# Patient Record
Sex: Male | Born: 1995 | Race: Black or African American | Hispanic: No | Marital: Single | Smoking: Never smoker
Health system: Southern US, Community
[De-identification: ages and names within clinical notes are randomized; demographics above are authoritative.]

## PROBLEM LIST (undated history)

## (undated) DIAGNOSIS — R109 Unspecified abdominal pain: Secondary | ICD-10-CM

## (undated) DIAGNOSIS — L309 Dermatitis, unspecified: Secondary | ICD-10-CM

## (undated) DIAGNOSIS — R110 Nausea: Secondary | ICD-10-CM

## (undated) DIAGNOSIS — R634 Abnormal weight loss: Secondary | ICD-10-CM

## (undated) DIAGNOSIS — R630 Anorexia: Secondary | ICD-10-CM

## (undated) DIAGNOSIS — K5900 Constipation, unspecified: Secondary | ICD-10-CM

## (undated) DIAGNOSIS — J45909 Unspecified asthma, uncomplicated: Secondary | ICD-10-CM

## (undated) HISTORY — DX: Unspecified asthma, uncomplicated: J45.909

## (undated) HISTORY — DX: Constipation, unspecified: K59.00

## (undated) HISTORY — DX: Dermatitis, unspecified: L30.9

## (undated) HISTORY — DX: Abnormal weight loss: R63.4

## (undated) HISTORY — DX: Nausea: R11.0

## (undated) HISTORY — DX: Anorexia: R63.0

## (undated) HISTORY — DX: Unspecified abdominal pain: R10.9

---
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
## 2017-06-14 DIAGNOSIS — J209 Acute bronchitis, unspecified: Secondary | ICD-10-CM | POA: Diagnosis not present

## 2017-07-11 ENCOUNTER — Encounter: Payer: Self-pay | Admitting: Allergy & Immunology

## 2017-07-11 ENCOUNTER — Ambulatory Visit (INDEPENDENT_AMBULATORY_CARE_PROVIDER_SITE_OTHER): Payer: 59 | Admitting: Allergy & Immunology

## 2017-07-11 VITALS — BP 118/72 | HR 72 | Temp 98.6°F | Resp 14 | Ht 68.0 in | Wt 158.0 lb

## 2017-07-11 DIAGNOSIS — J4520 Mild intermittent asthma, uncomplicated: Secondary | ICD-10-CM

## 2017-07-11 DIAGNOSIS — J302 Other seasonal allergic rhinitis: Secondary | ICD-10-CM

## 2017-07-11 DIAGNOSIS — J3089 Other allergic rhinitis: Secondary | ICD-10-CM

## 2017-07-11 DIAGNOSIS — T7801XD Anaphylactic reaction due to peanuts, subsequent encounter: Secondary | ICD-10-CM | POA: Diagnosis not present

## 2017-07-11 MED ORDER — EPINEPHRINE 0.3 MG/0.3ML IJ SOAJ: 0.3000 mg | Freq: Once | INTRAMUSCULAR | 2 refills | Status: AC

## 2017-07-11 MED ORDER — OLOPATADINE HCL 0.6 % NA SOLN: 2.0000 | Freq: Every day | NASAL | 3 refills | Status: DC | PRN

## 2017-07-11 MED ORDER — FEXOFENADINE HCL 180 MG PO TABS: 180.0000 mg | ORAL_TABLET | Freq: Every day | ORAL | 5 refills | Status: DC

## 2017-07-11 NOTE — Progress Notes (Signed)
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 Seasonal and perennial allergic rhinitis - Testing today showed: trees, weeds, grasses, molds, dust mites and cockroach  - Stop the Zyrtec and the Singulair. - Avoidance measures provided. - Continue with Flonase (fluticasone) two sprays per nostril daily - Start Allegra (fexofenadine) 157m table once daily and Astelin (azelastine) 2 sprays per nostril 1-2 times daily as needed - You can use an extra dose of the antihistamine, if needed, for breakthrough symptoms.  - Consider nasal saline rinses 1-2 times daily to remove allergens from the nasal cavities as well as help with mucous clearance (this is especially helpful to do before the nasal sprays are given) - Consider allergy shots as a means of long-term control. - Allergy shots "re-train" and "reset" the immune system to ignore environmental allergens and decrease the resulting immune response to those allergens (sneezing, itchy watery eyes, runny nose, nasal congestion, etc).    - Allergy shots improve symptoms in 75-85% of patients.  - We can discuss more at the next appointment if the medications are not working for you.  2. Peanut-induced anaphylaxis - Testing was positive to peanut but negative to the tree nuts and the milk. - You can introduce tree nuts at home, as long as you are careful about cross contamination. - We will send in a prescription for AuviQ instead of EpiPen. - They should call you in 1-2 days to confirm your shipping address.  3. Mild intermittent asthma, uncomplicated -  Testing was normal today. - We do not need a controller medication at this time. - Continue with albuterol 4 puffs every 4-6 hours as needed.  4. Return in about 3 months (around 10/10/2017).   Subjective:   MDemontrae Gilbertis a 21y.o. male presenting today for evaluation of  Chief Complaint  Patient presents with  . Allergic Rhinitis   . Asthma    very well controlled.   . Food Intolerance    peanuts, dairy.     MKiril Hippehas a history of the following: Patient Active Problem List   Diagnosis Date Noted  . Periumbilical abdominal pain   . Nausea   . Decreased appetite   . Weight loss   . Unspecified constipation     History obtained from: chart review and patient and his mother.  MZannie Covewas referred by WOrpha Bur DO.     MKollynis a 21y.o. male presenting to re-establish care.  He was followed by Dr. HIshmael Holter who has since left this practice. Unfortunately, we do not have his paper chart because we are in the midst of scanning in all of our paper charts. It has definitely been more than three years, however, according to his mother.   Asthma/Respiratory Symptom History: He was diagnosed with asthma when he was much younger. He has albuterol as well as Singulair at night. He denies nighttime coughing and daytime coughing and wheezing. He estimates that he uses his albuterol less than once per week. He denies ED visits and UC visits.   Allergic Rhinitis Symptom History: He has nasal congestion on a chronic basis throughout the year, even in  the winter. He used to it at this point. He does have occasional sneezing. He denies ocular symptoms. Symptoms remain constant throughout different environments. It flares with changes in the seasons.   Food Allergy Symptom History: He has a history of a peanut allergy with hives and swelling. He was age 38 months. He thinks that it was elementary school when he was last tested. Evidently, he had a blood drawn in middle school  from his PCP and was still allergic. He avoids all tree nuts as well. He eats eggs but apparently this was positive at some point. He did have a positive dairy test at some point but he eats it all of the time. He does have an up to date EpiPen but has never had to use it.   Otherwise, there is no history of other atopic diseases, including drug allergies, stinging insect allergies, or urticaria. There is no significant infectious history. Vaccinations are up to date.    Past Medical History: Patient Active Problem List   Diagnosis Date Noted  . Periumbilical abdominal pain   . Nausea   . Decreased appetite   . Weight loss   . Unspecified constipation     Medication List:  Allergies as of 07/11/2017      Reactions   Food    Eggs, peanuts      Medication List       Accurate as of 07/11/17 11:05 AM. Always use your most recent med list.          albuterol 108 (90 Base) MCG/ACT inhaler Commonly known as:  PROVENTIL HFA;VENTOLIN HFA Inhale 1 puff into the lungs every 6 (six) hours as needed for wheezing or shortness of breath.   EPINEPHrine 0.3 mg/0.3 mL Soaj injection Commonly known as:  AUVI-Q Inject 0.3 mLs (0.3 mg total) into the muscle once.   fexofenadine 180 MG tablet Commonly known as:  ALLEGRA Take 1 tablet (180 mg total) by mouth daily.   fluticasone 50 MCG/ACT nasal spray Commonly known as:  FLONASE Place into the nose.   Olopatadine HCl 0.6 % Soln Place 2 drops (2 puffs total) into the nose daily as needed.            Discharge Care Instructions        Start     Ordered   07/11/17 0000  Spirometry with Graph    Question:  Where should this test be performed?  Answer:  Other   07/11/17 1046   07/11/17 0000  Allergy Test    Question:  Allergy test to perform  Answer:  1-59 and select foods   07/11/17 1046   07/11/17 0000  EPINEPHrine (AUVI-Q) 0.3 mg/0.3 mL IJ SOAJ injection   Once    Comments:  [phone] (H)   07/11/17 1046   07/11/17 0000   fexofenadine (ALLEGRA) 180 MG tablet  Daily     07/11/17 1046   07/11/17 0000  Olopatadine HCl 0.6 % SOLN  Daily PRN     07/11/17 1046      Birth History: non-contributory. Born at term without complications.   Developmental History: Yaniel has met all milestones on time. He has required no speech therapy, occupational therapy, or physical therapy.   Past Surgical History: History reviewed. No pertinent surgical history.   Family History: Family History  Problem Relation Age of Onset  . Irritable bowel syndrome Mother   . Cholelithiasis Neg Hx   . Ulcers Neg Hx      Social  History: Zakar lives at home with his mother. 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 They live in a house that is 19 years old. There is laminate throughout the entire house. They have gas heating and central cooling. There is one dog in the home. He does have older siblings, but they do not live in the house. There are no dust mite coverings on the bedding and there is no tobacco exposure.     Review of Systems: a 14-point review of systems is pertinent for what is mentioned in HPI.  Otherwise, all other systems were negative. Constitutional: negative other than that listed in the HPI Eyes: negative other than that listed in the HPI Ears, nose, mouth, throat, and face: negative other than that listed in the HPI Respiratory: negative other than that listed in the HPI Cardiovascular: negative other than that listed in the HPI Gastrointestinal: negative other than that listed in the HPI Genitourinary: negative other than that listed in the HPI Integument: negative other than that listed in the HPI Hematologic: negative other than that listed in the HPI Musculoskeletal: negative other than that listed in the HPI Neurological: negative other than that listed in the HPI Allergy/Immunologic: negative other than that listed in the HPI    Objective:   Blood pressure 118/72,  pulse 72, temperature 98.6 F (37 C), temperature source Oral, resp. rate 14, height _0  (1.727 m), weight 158 lb (71.7 kg), SpO2 98 %. Body mass index is 24.02 kg/m.   Physical Exam:  General: Alert, interactive, in no acute distress. Pleasant male.  Eyes: No conjunctival injection present on the right, No conjunctival injection present on the left, PERRL bilaterally, No discharge on the right, No discharge on the left and No Horner-Trantas dots present Ears: Right TM pearly gray with normal light reflex, Left TM pearly gray with normal light reflex, Right TM intact without perforation and Left TM intact without perforation.  Nose/Throat: External nose within normal limits and septum midline, turbinates markedly edematous with clear discharge, post-pharynx erythematous with cobblestoning in the posterior oropharynx. Tonsils 2+ without exudates Neck: Supple without thyromegaly.  Adenopathy: shoddy bilateral anterior cervical lymphadenopathy. and no enlarged lymph nodes appreciated in the occipital, axillary, epitrochlear, inguinal, or popliteal regions. Lungs: Clear to auscultation without wheezing, rhonchi or rales. No increased work of breathing. CV: Normal S1/S2, no murmurs. Capillary refill <2 seconds.  Abdomen: Nondistended, nontender. No guarding or rebound tenderness. Bowel sounds present in all fields and hypoactive  Skin: Warm and dry, without lesions or rashes. Extremities:  No clubbing, cyanosis or edema. Neuro:   Grossly intact. No focal deficits appreciated. Responsive to questions.  Diagnostic studies:   Spirometry: results normal (FEV1: 3.96/106%, FVC: 4.92/114%, FEV1/FVC: 80%).    Spirometry consistent with normal pattern.   Allergy Studies:   Indoor/Outdoor Percutaneous Adult Environmental Panel: positive to [place] grass, English plantain, hickory, oak, pine, Alternaria, Cladosporium, Aspergillus, Penicillium, Bipolaris, Drechslera, Fusarium, Aureobasidium, Botrytis,  epicoccum, Phoma, Candida, Df mite, Dp mites and cockroach. Otherwise negative with adequate controls.  Selected Food Panel: positive to Peanut (30 x 40) with adequate controls. Negative to Milk, Casein, [place], [place], [place], [place] and [place] nut       Salvatore Marvel, MD [facility]

## 2017-07-11 NOTE — Patient Instructions (Addendum)
1. Seasonal and perennial allergic rhinitis - Testing today showed: trees, weeds, grasses, molds, dust mites and cockroach  - Stop the Zyrtec and the Singulair. - Avoidance measures provided. - Continue with Flonase (fluticasone) two sprays per nostril daily - Start Allegra (fexofenadine)  table once daily and Astelin (azelastine) 2 sprays per nostril 1-2 times daily as needed - You can use an extra dose of the antihistamine, if needed, for breakthrough symptoms.  - Consider nasal saline rinses 1-2 times daily to remove allergens from the nasal cavities as well as help with mucous clearance (this is especially helpful to do before the nasal sprays are given) - Consider allergy shots as a means of long-term control. - Allergy shots "re-train" and "reset" the immune system to ignore environmental allergens and decrease the resulting immune response to those allergens (sneezing, itchy watery eyes, runny nose, nasal congestion, etc).    - Allergy shots improve symptoms in 75-85% of patients.  - We can discuss more at the next appointment if the medications are not working for you.  2. Peanut-induced anaphylaxis, subsequent encounter - Testing was positive to peanut but negative to the tree nuts and the milk. - You can introduce tree nuts at home, as long as you are careful about cross contamination. - We will send in a prescription for AuviQ instead of EpiPen. - They should call you in 1-2 days to confirm your shipping address.  3. Mild intermittent asthma, uncomplicated - Testing was normal today. - We do not need a controller medication at this time. - Continue with albuterol 4 puffs every 4-6 hours as needed.  4. Return in about 3 months (around 10/10/2017).   Please inform us of any Emergency Department visits, hospitalizations, or changes in symptoms. Call us before going to the ED for breathing or allergy symptoms since we might be able to fit you in for a sick visit. Feel free to  contact us anytime with any questions, problems, or concerns.  It was a pleasure to meet you and your family today! Enjoy the new school year!  Websites that have reliable patient information: 1. American Academy of Asthma, Allergy, and Immunology: www.aaaai.org 2. Food Allergy Research and Education (FARE): foodallergy.org 3. Mothers of Asthmatics: http://www.asthmacommunitynetwork.org 4. American College of Allergy, Asthma, and Immunology: www.acaai.org   Election Day is coming up on Tuesday, November 6th! Make your voice heard! Register to vote at [email or web address]!     Reducing Pollen Exposure  The American Academy of Allergy, Asthma and Immunology suggests the following steps to reduce your exposure to pollen during allergy seasons.    1. Do not hang sheets or clothing out to dry; pollen may collect on these items. 2. Do not mow lawns or spend time around freshly cut grass; mowing stirs up pollen. 3. Keep windows closed at night.  Keep car windows closed while driving. 4. Minimize morning activities outdoors, a time when pollen counts are usually at their highest. 5. Stay indoors as much as possible when pollen counts or humidity is high and on windy days when pollen tends to remain in the air longer. 6. Use air conditioning when possible.  Many air conditioners have filters that trap the pollen spores. 7. Use a HEPA room air filter to remove pollen form the indoor air you breathe.  Control of Mold Allergen  Mold and fungi can grow on a variety of surfaces provided certain temperature and moisture conditions exist.  Outdoor molds grow on plants, decaying vegetation and soil.  The major outdoor mold, Alternaria and Cladosporium, are found in very high numbers during hot and dry conditions.  Generally, a late Summer - Fall peak is seen for common outdoor fungal spores.  Rain will temporarily lower outdoor mold spore count, but counts rise rapidly when the rainy period ends.  The most important  indoor molds are Aspergillus and Penicillium.  Dark, humid and poorly ventilated basements are ideal sites for mold growth.  The next most common sites of mold growth are the bathroom and the kitchen.  Outdoor MicrosoftMold Control 1. Use air conditioning and keep windows closed 2. Avoid exposure to decaying vegetation. 3. Avoid leaf raking. 4. Avoid grain handling. 5. Consider wearing a face mask if working in moldy areas.  Indoor Mold Control 1. Maintain humidity below 50%. 2. Clean washable surfaces with 5% bleach solution. 3. Remove sources e.g. contaminated carpets.  Control of House Dust Mite Allergen    House dust mites play a major role in allergic asthma and rhinitis.  They occur in environments with high humidity wherever human skin, the food for dust mites is found. High levels have been detected in dust obtained from mattresses, pillows, carpets, upholstered furniture, bed covers, clothes and soft toys.  The principal allergen of the house dust mite is found in its feces.  A gram of dust may contain 1,000 mites and 250,000 fecal particles.  Mite antigen is easily measured in the air during house cleaning activities.    1. Encase mattresses, including the box spring, and pillow, in an air tight cover.  Seal the zipper end of the encased mattresses with wide adhesive tape. 2. Wash the bedding in water of 130 degrees Farenheit weekly.  Avoid cotton comforters/quilts and flannel bedding: the most ideal bed covering is the dacron comforter. 3. Remove all upholstered furniture from the bedroom. 4. Remove carpets, carpet padding, rugs, and non-washable window drapes from the bedroom.  Wash drapes weekly or use plastic window coverings. 5. Remove all non-washable stuffed toys from the bedroom.  Wash stuffed toys weekly. 6. Have the room cleaned frequently with a vacuum cleaner and a damp dust-mop.  The patient should not be in a room which is being cleaned and should wait 1 hour after cleaning  before going into the room. 7. Close and seal all heating outlets in the bedroom.  Otherwise, the room will become filled with dust-laden air.  An electric heater can be used to heat the room. 8. Reduce indoor humidity to less than 50%.  Do not use a humidifier.  Control of Cockroach Allergen  Cockroach allergen has been identified as an important cause of acute attacks of asthma, especially in urban settings.  There are fifty-five species of cockroach that exist in the [place], however only three, the [place], [place] and Oriental species produce allergen that can affect patients with Asthma.  Allergens can be obtained from fecal particles, egg casings and secretions from cockroaches.    1. Remove food sources. 2. Reduce access to water. 3. Seal access and entry points. 4. Spray runways with 0.5-1% Diazinon or Chlorpyrifos 5. Blow boric acid power under stoves and refrigerator. 6. Place bait stations (hydramethylnon) at feeding sites.

## 2017-10-12 ENCOUNTER — Encounter: Payer: Self-pay | Admitting: Allergy & Immunology

## 2017-10-12 ENCOUNTER — Ambulatory Visit (INDEPENDENT_AMBULATORY_CARE_PROVIDER_SITE_OTHER): Payer: 59 | Admitting: Allergy & Immunology

## 2017-10-12 VITALS — BP 122/78 | HR 83 | Resp 17

## 2017-10-12 DIAGNOSIS — R253 Fasciculation: Secondary | ICD-10-CM | POA: Diagnosis not present

## 2017-10-12 DIAGNOSIS — T7801XA Anaphylactic reaction due to peanuts, initial encounter: Secondary | ICD-10-CM | POA: Insufficient documentation

## 2017-10-12 DIAGNOSIS — T7801XD Anaphylactic reaction due to peanuts, subsequent encounter: Secondary | ICD-10-CM | POA: Diagnosis not present

## 2017-10-12 DIAGNOSIS — J4520 Mild intermittent asthma, uncomplicated: Secondary | ICD-10-CM | POA: Insufficient documentation

## 2017-10-12 DIAGNOSIS — J302 Other seasonal allergic rhinitis: Secondary | ICD-10-CM | POA: Diagnosis not present

## 2017-10-12 DIAGNOSIS — J3089 Other allergic rhinitis: Secondary | ICD-10-CM | POA: Diagnosis not present

## 2017-10-12 NOTE — Patient Instructions (Addendum)
1. Seasonal and perennial allergic rhinitis (trees, weeds, grasses, molds, dust mites and cockroach) - Continue with the as needed use of Allegra (fexofenadine) 180mg  table once daily, Flonase (fluticasone) two sprays per nostril daily and Astelin (azelastine) 2 sprays per nostril 1-2 times daily as needed - You can use an extra dose of the antihistamine, if needed, for breakthrough symptoms.   2. Peanut-induced anaphylaxis - Chad Simmons is up to date.  - Continue to avoid peanuts.   3. Mild intermittent asthma, uncomplicated - Testing was normal today. - We do not need a controller medication at this time. - Continue with albuterol 4 puffs every 4-6 hours as needed.  4. Muscle twitching - I would talk to your PCP about this.  - She might want to do more of a workup.  - Try limiting your caffeine intake.   5. Return in about 1 year (around 10/12/2018).

## 2017-10-12 NOTE — Progress Notes (Signed)
FOLLOW UP  Date of Service/Encounter:  10/12/17   Assessment:   Mild intermittent asthma without complication  Peanut-induced anaphylaxis - tolerates tree nuts  Seasonal and perennial allergic rhinitis (trees, weeds, grasses, molds, dust mites and cockroach)  Plan/Recommendations:   1. Seasonal and perennial allergic rhinitis (trees, weeds, grasses, molds, dust mites and cockroach) - Continue with the as needed use of Allegra (fexofenadine) 180mg  table once daily, Flonase (fluticasone) two sprays per nostril daily and Astelin (azelastine) 2 sprays per nostril 1-2 times daily as needed - You can use an extra dose of the antihistamine, if needed, for breakthrough symptoms.   2. Peanut-induced anaphylaxis - Audry RilesuviQ is up to date.  - Continue to avoid peanuts.   3. Mild intermittent asthma, uncomplicated - Testing was normal today. - We do not need a controller medication at this time. - Continue with albuterol 4 puffs every 4-6 hours as needed.  4. Muscle twitching - I would talk to your PCP about this.  - She might want to do more of a workup.  - Try limiting your caffeine intake.   5. Return in about 1 year (around 10/12/2018).   Subjective:   Chad Simmons is a 21 y.o. male presenting today for follow up of  Chief Complaint  Patient presents with  . Asthma    Chad Simmons has a history of the following: Patient Active Problem List   Diagnosis Date Noted  . Mild intermittent asthma without complication 10/12/2017  . Seasonal and perennial allergic rhinitis 10/12/2017  . 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 Periumbilical abdominal pain   . Nausea   . Decreased appetite   . Weight loss   . Unspecified constipation     History obtained from: chart review and patient.  Chad Simmons's Primary Care Provider is Suzanna ObeyWallace, Celeste, DO.     Chad Simmons is a 21 y.o. male presenting for a follow up visit. Chad Simmons was last seen in September 2018. At that time,  he had testing that was positive to trees, weeds, grasses, molds, dust mites, and cockroach. I diagnosed him with intermittent asthma and we continued him on albuterol as needed. We stopped Singulair and Zyrtec. We continued him on Flonase and gave him samples of Allegra and Astelin. Testing was positive to peanut but negative to the rest of the tree nuts.   Since the last visit, he has done well. He will have an occasional sneeze but otherwise he is doing fine without any problems. He stopped his Allegra and nasal sprays. He does not feel that he needs any of the medications on a regular basis. He is not interested in allergen immunotherapy.   Asthma is under good control. He has not used his albuterol in over three months. Tarl's asthma has been well controlled. He has not required rescue medication, experienced nocturnal awakenings due to lower respiratory symptoms, nor have activities of daily living been limited. He has required no Emergency Department or Urgent Care visits for his asthma. He has required zero courses of systemic steroids for asthma exacerbations since the last visit. ACT score today is 25, indicating excellent asthma symptom control.   He does report twitching when he is about to go to sleep at night. This has been ongoing for two weeks in a row. He has twitching mostly on the right side of his body. He has not discussed this with his PCP. He has no history of seizures. Muscle relaxant did help somewhat (his mother gave him  some of her muscle relaxants). His sister has a remote history of seizures. It does not seem that he has a post-ictal period. He denies hallucinations.   Otherwise, there have been no changes to his past medical history, surgical history, family history, or social history.    Review of Systems: a 14-point review of systems is pertinent for what is mentioned in HPI.  Otherwise, all other systems were negative. Constitutional: negative other than that listed in  the HPI Eyes: negative other than that listed in the HPI Ears, nose, mouth, throat, and face: negative other than that listed in the HPI Respiratory: negative other than that listed in the HPI Cardiovascular: negative other than that listed in the HPI Gastrointestinal: negative other than that listed in the HPI Genitourinary: negative other than that listed in the HPI Integument: negative other than that listed in the HPI Hematologic: negative other than that listed in the HPI Musculoskeletal: negative other than that listed in the HPI Neurological: negative other than that listed in the HPI Allergy/Immunologic: negative other than that listed in the HPI    Objective:   Blood pressure 122/78, pulse 83, resp. rate 17, SpO2 98 %. There is no height or weight on file to calculate BMI.   Physical Exam:  General: Alert, interactive, in no acute distress. Pleasant male. Wearing headphones.  Eyes: No conjunctival injection bilaterally, no discharge on the right, no discharge on the left and no Horner-Trantas dots present. PERRL bilaterally. EOMI without pain. No photophobia.  Ears: Right TM pearly gray with normal light reflex, Left TM pearly gray with normal light reflex, Right TM intact without perforation and Left TM intact without perforation.  Nose/Throat: External nose within normal limits and septum midline. Turbinates edematous and pale with clear discharge. Posterior oropharynx erythematous without cobblestoning in the posterior oropharynx. Tonsils 2+ without exudates.  Tongue without thrush. Adenopathy: no enlarged lymph nodes appreciated in the anterior cervical, occipital, axillary, epitrochlear, inguinal, or popliteal regions. Lungs: Clear to auscultation without wheezing, rhonchi or rales. No increased work of breathing. CV: Normal S1/S2. No murmurs. Capillary refill <2 seconds.  Skin: Warm and dry, without lesions or rashes. Neuro:   Grossly intact. No focal deficits  appreciated. Responsive to questions.  Diagnostic studies:   Spirometry: results normal (FEV1: 4.04/109%, FVC: 4.67/109%, FEV1/FVC: 87%).    Spirometry consistent with normal pattern.   Allergy Studies: none     Malachi BondsJoel Ercell Razon, MD [facility] [place]

## 2017-12-02 DIAGNOSIS — G4700 Insomnia, unspecified: Secondary | ICD-10-CM | POA: Diagnosis not present

## 2018-01-03 DIAGNOSIS — J302 Other seasonal allergic rhinitis: Secondary | ICD-10-CM | POA: Diagnosis not present

## 2018-01-03 DIAGNOSIS — J4520 Mild intermittent asthma, uncomplicated: Secondary | ICD-10-CM | POA: Diagnosis not present

## 2018-01-03 DIAGNOSIS — G4700 Insomnia, unspecified: Secondary | ICD-10-CM | POA: Diagnosis not present

## 2018-08-18 DIAGNOSIS — M25571 Pain in right ankle and joints of right foot: Secondary | ICD-10-CM | POA: Diagnosis not present

## 2018-11-29 DIAGNOSIS — M9902 Segmental and somatic dysfunction of thoracic region: Secondary | ICD-10-CM | POA: Diagnosis not present

## 2018-11-29 DIAGNOSIS — M9907 Segmental and somatic dysfunction of upper extremity: Secondary | ICD-10-CM | POA: Diagnosis not present

## 2018-11-29 DIAGNOSIS — M9901 Segmental and somatic dysfunction of cervical region: Secondary | ICD-10-CM | POA: Diagnosis not present

## 2018-12-28 ENCOUNTER — Ambulatory Visit: Payer: 59 | Attending: Family Medicine | Admitting: Family Medicine

## 2018-12-28 ENCOUNTER — Encounter: Payer: Self-pay | Admitting: Family Medicine

## 2018-12-28 VITALS — BP 124/62 | HR 74 | Temp 98.2°F | Resp 18 | Ht 67.0 in | Wt 180.0 lb

## 2018-12-28 DIAGNOSIS — G4700 Insomnia, unspecified: Secondary | ICD-10-CM | POA: Diagnosis not present

## 2018-12-28 MED ORDER — TRAZODONE HCL 50 MG PO TABS: 25.0000 mg | ORAL_TABLET | Freq: Every evening | ORAL | 3 refills | Status: DC | PRN

## 2018-12-28 NOTE — Patient Instructions (Signed)

## 2018-12-28 NOTE — Progress Notes (Signed)
Patient complains of 6 months of not being able to fall asleep or stay asleep. Patient began taking his mothers

## 2018-12-28 NOTE — Progress Notes (Signed)
Subjective:    Patient ID: Chad Simmons, male    DOB: Aug 25, 1996, 23 y.o.   MRN: [id number]  HPI   23 yo male new to the practice. Patient reports that he has been healthy overall but his main issue is that he has difficulty falling asleep and staying asleep. He has tried otc medications without significant improvement in sleep. He does not do shift work. He does not believe that he snores and no symptoms of moving his limbs or the sensation of needing to move his limbs. He denies any depression or anxiety.   Past Medical History:  Diagnosis Date  . Abdominal pain   . Asthma   . Constipation   . Decreased appetite   . Eczema   . Nausea   . Weight loss   Surgical History: Denies past surgeries Family History  Problem Relation Age of Onset  . Irritable bowel syndrome Mother   . Cholelithiasis Neg Hx   . Ulcers Neg Hx    Social History   Tobacco Use  . Smoking status: Never Smoker  . Smokeless tobacco: Never Used  Substance Use Topics  . Alcohol use: No  . Drug use: No   Allergies  Allergen Reactions  . Food     Eggs, peanuts     Review of Systems  Constitutional: Positive for fatigue. Negative for chills and fever.  HENT: Negative for congestion, sore throat and trouble swallowing.   Eyes: Negative for photophobia and visual disturbance.  Respiratory: Negative for cough, shortness of breath and wheezing.   Cardiovascular: Negative for chest pain, palpitations and leg swelling.  Gastrointestinal: Negative for abdominal pain, blood in stool, constipation, diarrhea and nausea.  Endocrine: Negative for cold intolerance, heat intolerance, polydipsia, polyphagia and polyuria.  Genitourinary: Negative for dysuria, flank pain and frequency.  Musculoskeletal: Negative for arthralgias, back pain, gait problem, joint swelling and myalgias.  Neurological: Negative for dizziness and headaches.  Hematological: Negative for adenopathy. Does not bruise/bleed easily.    Psychiatric/Behavioral: Positive for sleep disturbance. Negative for self-injury and suicidal ideas. The patient is not nervous/anxious.        Objective:   Physical Exam Constitutional:      General: He is not in acute distress.    Appearance: Normal appearance. He is not ill-appearing.  HENT:     Head: Normocephalic and atraumatic.     Right Ear: Tympanic membrane, ear canal and external ear normal.     Left Ear: Tympanic membrane, ear canal and external ear normal.     Nose: Nose normal. No congestion or rhinorrhea.     Mouth/Throat:     Mouth: Mucous membranes are moist.     Pharynx: Oropharynx is clear.  Eyes:     Extraocular Movements: Extraocular movements intact.     Conjunctiva/sclera: Conjunctivae normal.  Neck:     Musculoskeletal: Normal range of motion and neck supple. No muscular tenderness.  Cardiovascular:     Rate and Rhythm: Normal rate and regular rhythm.  Pulmonary:     Effort: Pulmonary effort is normal.     Breath sounds: Normal breath sounds.  Abdominal:     Palpations: Abdomen is soft.     Tenderness: There is no abdominal tenderness. There is no right CVA tenderness, left CVA tenderness, guarding or rebound.  Musculoskeletal:        General: No swelling or tenderness.     Right lower leg: No edema.     Left lower leg: No edema.  Lymphadenopathy:  Cervical: No cervical adenopathy.  Neurological:     General: No focal deficit present.     Mental Status: He is alert and oriented to person, place, and time.     Cranial Nerves: No cranial nerve deficit.  Psychiatric:        Mood and Affect: Mood normal.        Behavior: Behavior normal.    BP 124/62 (BP Location: Left Arm, Patient Position: Sitting, Cuff Size: Normal)   Pulse 74   Temp 98.2 F (36.8 C) (Oral)   Resp 18   Ht 5\' 7"  (1.702 m)   Wt 180 lb (81.6 kg)   SpO2 99%   BMI 28.19 kg/m         Assessment & Plan:  1. Insomnia, unspecified type Sleep hygiene discussed with the  patient and patient would like to try a prescription medication to help with sleep. Rx provided for patient to take trazodone 50 mg at bedtime as well as using sleep hygiene measures. Patient does not seem to have factors suggestive of sleep apnea that would require referral for a sleep study at this time. Notify office/call or return if problems with the medication or inability to sleep - traZODone (DESYREL) 50 MG tablet; Take 0.5-1 tablets (25-50 mg total) by mouth at bedtime as needed for sleep.  Dispense: 30 tablet; Refill: 3  An After Visit Summary was printed and given to the patient.  Return in about 6 weeks (around 02/08/2019) for insominia follow-up.

## 2019-02-08 ENCOUNTER — Ambulatory Visit: Payer: 59 | Admitting: Family Medicine

## 2019-04-05 ENCOUNTER — Ambulatory Visit: Payer: 59 | Admitting: Family Medicine

## 2019-04-05 ENCOUNTER — Encounter: Payer: Self-pay | Admitting: Family Medicine

## 2019-04-05 ENCOUNTER — Other Ambulatory Visit: Payer: Self-pay

## 2019-04-05 VITALS — BP 133/75 | HR 87 | Temp 98.8°F | Resp 17 | Ht 67.0 in | Wt 167.6 lb

## 2019-04-05 DIAGNOSIS — G4700 Insomnia, unspecified: Secondary | ICD-10-CM | POA: Diagnosis not present

## 2019-04-05 DIAGNOSIS — J4520 Mild intermittent asthma, uncomplicated: Secondary | ICD-10-CM | POA: Diagnosis not present

## 2019-04-05 DIAGNOSIS — F909 Attention-deficit hyperactivity disorder, unspecified type: Secondary | ICD-10-CM | POA: Diagnosis not present

## 2019-04-05 MED ORDER — ALBUTEROL SULFATE HFA 108 (90 BASE) MCG/ACT IN AERS
2.0000 | INHALATION_SPRAY | RESPIRATORY_TRACT | 1 refills | Status: DC | PRN
Start: 2019-04-05 — End: 2020-01-25

## 2019-04-05 MED ORDER — TRAZODONE HCL 100 MG PO TABS: 100.0000 mg | ORAL_TABLET | Freq: Every evening | ORAL | 2 refills | Status: DC | PRN

## 2019-04-05 NOTE — Patient Instructions (Addendum)
Attention Deficit Hyperactivity Disorder, Adult Attention deficit hyperactivity disorder (ADHD) is a mental health disorder that starts during childhood. For many people with ADHD, the disorder continues into adult years. There are many things that you and your health care provider or therapist (mental health professional) can do to manage your symptoms. What are the causes? The exact cause of ADHD is not known. What increases the risk? You are more likely to develop this condition if:  You have a family history of ADHD.  You are male.  You were born to a mother who smoked or drank alcohol during pregnancy.  You were exposed to lead poisoning or other toxins in the womb or in early life.  You were born before 37 weeks of pregnancy (prematurely) or you had a low birth weight.  You have experienced a brain injury. What are the signs or symptoms? Symptoms of this condition depend on the type of ADHD. The two main types are inattentive and hyperactive-impulsive. Some people may have symptoms of both types. Symptoms of the inattentive type include:  Difficulty watching, listening, or thinking with focused effort (paying attention).  Making careless mistakes.  Not listening.  Not following instructions.  Being disorganized.  Avoiding tasks that require time and attention.  Losing things.  Forgetting things.  Being easily distracted. Symptoms of the hyperactive-impulsive type include:  Restlessness.  Talking too much.  Interrupting.  Difficulty with: ? Sitting still. ? Staying quiet. ? Feeling motivated. ? Relaxing. ? Waiting in line or waiting for a turn. How is this diagnosed? This condition is diagnosed based on your current symptoms and your history of symptoms. The diagnosis can be made by a provider such as a primary care provider, psychiatrist, psychologist, or clinical social worker. The provider may use a symptom checklist or a standardized behavior rating  scale to evaluate your symptoms. He or she may want to talk with family members who have known you for a long time and have observed your behaviors. There are no lab tests or brain imaging tests that can diagnose ADHD. How is this treated? This condition can be treated with medicines and behavior therapy. Medicines may be the best option to reduce impulsive behaviors and improve attention. Your health care provider may recommend:  Stimulant medicines. These are the most common medicines used for adult ADHD. They affect certain chemicals in the brain (neurotransmitters). These medicines may be long-acting or short-acting. This will determine how often you need to take the medicine.  A non-stimulant medicine for adult ADHD (atomoxetine). This medicine increases a neurotransmitter called norepinephrine. It may take weeks to months to see effects from this medicine. Psychotherapy and behavioral management are also important for treating ADHD. Psychotherapy is often used along with medicine. Your health care provider may suggest:  Cognitive behavioral therapy (CBT). This type of therapy teaches you to replace negative thoughts and actions with positive thoughts and actions. When used as part of ADHD treatment, this therapy may also include: ? Coping strategies for organization, time management, impulse control, and stress reduction. ? Mindfulness and meditation training.  Behavioral management. This may include strategies for organization and time management. You may work with an ADHD coach who is specially trained to help people with ADHD to manage and organize activities and to function more effectively. Follow these instructions at home: Medicines   Take over-the-counter and prescription medicines only as told by your health care provider.  Talk with your health care provider about the possible side effects of  your medicine to watch for. General instructions   Learn as much as you can about  adult ADHD, and work closely with your health care providers to find the treatments that work best for you.  Do not use drugs or abuse alcohol. Limit alcohol intake to no more than 1 drink a day for nonpregnant women and 2 drinks a day for men. One drink equals 12 oz of beer, 5 oz of wine, or 1 oz of hard liquor.  Follow the same schedule each day. Make sure your schedule includes enough time for you to get plenty of sleep.  Use reminder devices like notes, calendars, and phone apps to stay on-time and organized.  Eat a healthy diet. Do not skip meals.  Exercise regularly. Exercise can help to reduce stress and anxiety.  Keep all follow-up visits as told by your health care provider and therapist. This is important. Where to find more information  A health care provider may be able to recommend resources that are available online or over the phone. You could start with: ? Attention Deficit Disorder Association (ADDA): http://davis-dillon.net/ ? [employer] of Mental Health [place] Day): http://www.maynard.net/ Contact a health care provider if:  Your symptoms are changing, getting worse, or not improving.  You have side effects from your medicine, such as: ? Repeated muscle twitches, coughing, or speech outbursts. ? Sleep problems. ? Loss of appetite. ? Depression. ? New or worsening behavior problems. ? Dizziness. ? Unusually fast heartbeat. ? Stomach pains. ? Headaches.  You are struggling with anxiety, depression, or substance abuse. Get help right away if:  You have a severe reaction to a medicine.  You have thoughts of hurting yourself or others. If you ever feel like you may hurt yourself or others, or have thoughts about taking your own life, get help right away. You can go to the nearest emergency department or call:  Your local emergency services (911 in the [place]  A suicide crisis helpline, such as the National Suicide Prevention Lifeline at 331 258 1767. This is open 24 hours a  day. Summary  ADHD is a mental health disorder that starts during childhood and often continues into adult years.  The exact cause of ADHD is not known.  There is no cure for ADHD, but treatment with medicine, therapy, or behavioral training can help you manage your condition. This information is not intended to replace advice given to you by your health care provider. Make sure you discuss any questions you have with your health care provider. Document Released: 06/09/2017 Document Revised: 06/09/2017 Document Reviewed: 06/09/2017 Elsevier Interactive Patient Education  2019 [employer] Inc[employer] Insomnia Insomnia is a sleep disorder that makes it difficult to fall asleep or stay asleep. Insomnia can cause fatigue, low energy, difficulty concentrating, mood swings, and poor performance at work or school. There are three different ways to classify insomnia:  Difficulty falling asleep.  Difficulty staying asleep.  Waking up too early in the morning. Any type of insomnia can be long-term (chronic) or short-term (acute). Both are common. Short-term insomnia usually lasts for three months or less. Chronic insomnia occurs at least three times a week for longer than three months. What are the causes? Insomnia may be caused by another condition, situation, or substance, such as:  Anxiety.  Certain medicines.  Gastroesophageal reflux disease (GERD) or other gastrointestinal conditions.  Asthma or other breathing conditions.  Restless legs syndrome, sleep apnea, or other sleep disorders.  Chronic pain.  Menopause.  Stroke.  Abuse of alcohol,  tobacco, or illegal drugs.  Mental health conditions, such as depression.  Caffeine.  Neurological disorders, such as Alzheimer's disease.  An overactive thyroid (hyperthyroidism). Sometimes, the cause of insomnia may not be known. What increases the risk? Risk factors for insomnia include:  Gender. Women are affected more often than men.   Age. Insomnia is more common as you get older.  Stress.  Lack of exercise.  Irregular work schedule or working night shifts.  Traveling between different time zones.  Certain medical and mental health conditions. What are the signs or symptoms? If you have insomnia, the main symptom is having trouble falling asleep or having trouble staying asleep. This may lead to other symptoms, such as:  Feeling fatigued or having low energy.  Feeling nervous about going to sleep.  Not feeling rested in the morning.  Having trouble concentrating.  Feeling irritable, anxious, or depressed. How is this diagnosed? This condition may be diagnosed based on:  Your symptoms and medical history. Your health care provider may ask about: ? Your sleep habits. ? Any medical conditions you have. ? Your mental health.  A physical exam. How is this treated? Treatment for insomnia depends on the cause. Treatment may focus on treating an underlying condition that is causing insomnia. Treatment may also include:  Medicines to help you sleep.  Counseling or therapy.  Lifestyle adjustments to help you sleep better. Follow these instructions at home: Eating and drinking   Limit or avoid alcohol, caffeinated beverages, and cigarettes, especially close to bedtime. These can disrupt your sleep.  Do not eat a large meal or eat spicy foods right before bedtime. This can lead to digestive discomfort that can make it hard for you to sleep. Sleep habits   Keep a sleep diary to help you and your health care provider figure out what could be causing your insomnia. Write down: ? When you sleep. ? When you wake up during the night. ? How well you sleep. ? How rested you feel the next day. ? Any side effects of medicines you are taking. ? What you eat and drink.  Make your bedroom a dark, comfortable place where it is easy to fall asleep. ? Put up shades or blackout curtains to block light from outside.  ? Use a white noise machine to block noise. ? Keep the temperature cool.  Limit screen use before bedtime. This includes: ? Watching TV. ? Using your smartphone, tablet, or computer.  Stick to a routine that includes going to bed and waking up at the same times every day and night. This can help you fall asleep faster. Consider making a quiet activity, such as reading, part of your nighttime routine.  Try to avoid taking naps during the day so that you sleep better at night.  Get out of bed if you are still awake after 15 minutes of trying to sleep. Keep the lights down, but try reading or doing a quiet activity. When you feel sleepy, go back to bed. General instructions  Take over-the-counter and prescription medicines only as told by your health care provider.  Exercise regularly, as told by your health care provider. Avoid exercise starting several hours before bedtime.  Use relaxation techniques to manage stress. Ask your health care provider to suggest some techniques that may work well for you. These may include: ? Breathing exercises. ? Routines to release muscle tension. ? Visualizing peaceful scenes.  Make sure that you drive carefully. Avoid driving if you feel very sleepy.  Keep all follow-up visits as told by your health care provider. This is important. Contact a health care provider if:  You are tired throughout the day.  You have trouble in your daily routine due to sleepiness.  You continue to have sleep problems, or your sleep problems get worse. Get help right away if:  You have serious thoughts about hurting yourself or someone else. If you ever feel like you may hurt yourself or others, or have thoughts about taking your own life, get help right away. You can go to your nearest emergency department or call:  Your local emergency services (911 in the [place]  A suicide crisis helpline, such as the National Suicide Prevention Lifeline at 780 755 7222. This  is open 24 hours a day. Summary  Insomnia is a sleep disorder that makes it difficult to fall asleep or stay asleep.  Insomnia can be long-term (chronic) or short-term (acute).  Treatment for insomnia depends on the cause. Treatment may focus on treating an underlying condition that is causing insomnia.  Keep a sleep diary to help you and your health care provider figure out what could be causing your insomnia. This information is not intended to replace advice given to you by your health care provider. Make sure you discuss any questions you have with your health care provider. Document Released: 10/15/2000 Document Revised: 07/28/2017 Document Reviewed: 07/28/2017 Elsevier Interactive Patient Education  2019 [employer].

## 2019-04-05 NOTE — Progress Notes (Signed)
Patient ID: Chad Simmons, male    DOB: 15-Nov-1995, 23 y.o.   MRN: [id number]  PCP: Bing Neighbors, FNP  Chief Complaint  Patient presents with  . Establish Care  . Asthma    breathing is ok. no cough, SHOB, wheezing, chest tightness.  . Insomnia    states that he's having to take 50 mg tablets. wants to know if it can be prescribed as a 100 mg tablet    Subjective:  HPI  Chad Simmons is a 23 y.o. male presents for evaluation of asthma and insomnia.  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    Asthma Patient endorses mild asthma.  He has not had any recent exacerbations or complications associated with asthma.  He has an albuterol inhaler which is greater than 1 year old.  He endorses seasonal allergies however this year has not been severely bothered with allergies.  He intermittently takes antihistamines over-the-counter such as Zyrtec.  He denies any recent wheezing, shortness of breath, cough, or chest tightness.  Insomnia  Prescribed trazodone in February due to progressively worsening insomnia-like symptoms.  Initially prescribed 25 to 50 mg at bedtime and now he is currently consistently taking 50 mg at bedtime.  He requests prescription dose be increased to trazodone 100 mg at bedtime as needed.  Adult ADHD History of ADHD as a child.  He was previously prescribed stimulant medication has been off of medications for over 5 years.  He denies any known side effects while taking the medication just opted to stop taking it at around the age of 23 years old.  He endorses difficulty focusing, concentrating, and problems with motivation.  He is currently taking classes online for the summer and due to classes being on line he experiences delays in  completing assignments as he is unmotivated to get started. Social History   Socioeconomic History  . Marital status: Single    Spouse name: Not on file  . Number of children: Not on file  . Years of education: Not on file  . Highest education level: Not on file  Occupational History  . Not on file  Social Needs  . Financial resource strain: Not on file  . Food insecurity:    Worry: Not on file    Inability: Not on file  . Transportation needs:    Medical: Not on file    Non-medical: Not on file  Tobacco Use  . Smoking status: Never Smoker  . Smokeless tobacco: Never Used  Substance and Sexual Activity  . Alcohol use: No  . Drug use: No  . Sexual activity: Not on file  Lifestyle  . Physical activity:    Days per week: Not on file    Minutes per session: Not on file  . Stress: Not on file  Relationships  . Social connections:    Talks on phone: Not on file    Gets together: Not on file    Attends religious service: Not on file    Active member of club or organization: Not on file    Attends meetings of clubs or organizations: Not on file    Relationship status: Not on file  . Intimate partner violence:    Fear of current or ex partner: Not on file    Emotionally abused: Not on file  Physically abused: Not on file    Forced sexual activity: Not on file  Other Topics Concern  . Not on file  Social History Narrative   9th grade 2014-2015    Family History  Problem Relation Age of Onset  . Irritable bowel syndrome Mother   . Cholelithiasis Neg Hx   . Ulcers Neg Hx     Review of Systems Pertinent negatives listed in HPI Allergies  Allergen Reactions  . Food     Eggs, peanuts    Prior to Admission medications   Medication Sig Start Date End Date Taking? Authorizing Provider  albuterol (PROVENTIL HFA;VENTOLIN HFA) 108 (90 BASE) MCG/ACT inhaler Inhale 1 puff into the lungs every 6 (six) hours as needed for wheezing or shortness of breath.   Yes [provider]  EPINEPHrine 0.3 mg/0.3 mL IJ SOAJ injection Inject 0.3 mg into the muscle. 12/03/15  Yes [provider]  traZODone (DESYREL) 50 MG tablet Take 0.5-1 tablets (25-50 mg total) by mouth at bedtime as needed for sleep. Patient taking differently: Take 50-100 mg by mouth at bedtime as needed for sleep.  12/28/18  Yes Fulp, Hewitt Shortsammie, MD    Past Medical, Surgical Family and Social History reviewed and updated.    Objective:   Today's Vitals   04/05/19 1522  BP: 133/75  Pulse: 87  Resp: 17  Temp: 98.8 F (37.1 C)  TempSrc: Temporal  SpO2: 97%  Weight: 167 lb 9.6 oz (76 kg)  Height: 5\' 7"  (1.702 m)    BP Readings from Last 3 Encounters:  04/05/19 133/75  12/28/18 124/62  10/12/17 122/78    Filed Weights   04/05/19 1522  Weight: 167 lb 9.6 oz (76 kg)       Physical Exam General appearance: alert, well developed, well nourished, cooperative and in no distress Head: Normocephalic, without obvious abnormality, atraumatic Respiratory: Respirations even and unlabored, normal respiratory rate Heart: rate and rhythm normal. No gallop or murmurs noted on exam  Abdomen: BS +, no distention, no rebound tenderness, or no mass Extremities: No gross deformities Skin: Skin color, texture, turgor normal. No rashes seen  Psych: Appropriate mood and affect. Neurologic: Mental status: Alert, oriented to person, place, and time, thought content appropriate. Assessment & Plan:  1. Mild intermittent asthma without complication,controlled  -Albuterol (VENTOLIN HFA) Inhale 2 puffs into the lungs every 4 (four) hours as needed for wheezing or shortness of breath (cough, shortness of breath or wheezing.).    2. Insomnia, unspecified type, uncertain of etiology Continue  trazodone 100 mg total by mouth at bedtime as needed for sleep.  Encouraged routine physical exercise daily. No caffeine after 6 pm. Avoid screen time at least 1 hours prior to bedtime.  3. Adult ADHD -  Ambulatory referral to Psychiatry, referred to [place] [facility]   As visit was ending: patient reports more than 1 week ago eating a chicken sandwich and taking his evening dose of Trazodone 100 mg. Once he finished eating, he took a very hot shower and developed weakness, felt chest tightness and fatigue. Nearly experienced a syncopal episode. He did not go to the ER. He fell asleep and symptoms never reoccurred. Patient advised if symptoms reoccur, go immediately to the ER. Asymptomatic today.     Joaquin CourtsKimberly Anselm Aumiller, FNP-C [facility] [address], [place] [id number] [phone]: [phone]

## 2019-04-11 ENCOUNTER — Other Ambulatory Visit: Payer: Self-pay

## 2019-04-11 ENCOUNTER — Encounter: Payer: Self-pay | Admitting: Family Medicine

## 2019-04-11 ENCOUNTER — Ambulatory Visit
Admission: EM | Admit: 2019-04-11 | Discharge: 2019-04-11 | Disposition: A | Discharge status: Home / Self Care | Payer: 59 | Attending: Family Medicine | Admitting: Family Medicine

## 2019-04-11 DIAGNOSIS — N4830 Priapism, unspecified: Secondary | ICD-10-CM

## 2019-04-11 MED ORDER — KETOROLAC TROMETHAMINE 60 MG/2ML IM SOLN
60.0000 mg | Freq: Once | INTRAMUSCULAR | Status: AC
  Administered 2019-04-11: 60 mg via INTRAMUSCULAR

## 2019-04-11 NOTE — ED Triage Notes (Signed)
Pt c/o priapism since 7am, denies taking any medication, denies urinary difficulty

## 2019-04-11 NOTE — ED Provider Notes (Signed)
[facility]    CSN: [id number] Arrival date & time: 04/11/19  1256     History   Chief Complaint Chief Complaint  Patient presents with  . priapism    HPI Velmer Woelfel is a 23 y.o. male.   23 yo man with 6 hours of priapism.  Taking no drugs(ED meds included).  No h/o sickle cell disease.  No h/o prior priapism.  Having quite a bit of pain at present.  No fever.  No F/H sickle cell  Initial visit     Past Medical History:  Diagnosis Date  . Abdominal pain   . Asthma   . Constipation   . Decreased appetite   . Eczema   . Nausea   . Weight loss     Patient Active Problem List   Diagnosis Date Noted  . Mild intermittent asthma without complication 96/29/5284  . Seasonal and perennial allergic rhinitis 10/12/2017  . 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 Muscle twitch 10/12/2017  . Periumbilical abdominal pain   . Nausea   . Decreased appetite   . Weight loss   . Unspecified constipation     History reviewed. No pertinent surgical history.     Home Medications    Prior to Admission medications   Medication Sig Start Date End Date Taking? Authorizing Provider  albuterol (VENTOLIN HFA) 108 (90 Base) MCG/ACT inhaler Inhale 2 puffs into the lungs every 4 (four) hours as needed for wheezing or shortness of breath (cough, shortness of breath or wheezing.). 04/05/19   Scot Jun, FNP  EPINEPHrine 0.3 mg/0.3 mL IJ SOAJ injection Inject 0.3 mg into the muscle. 12/03/15   [provider]  traZODone (DESYREL) 100 MG tablet Take 1 tablet (100 mg total) by mouth at bedtime as needed for sleep. 04/05/19   Scot Jun, FNP    Family History Family History  Problem Relation Age of Onset  . Irritable bowel syndrome Mother   . Cholelithiasis Neg Hx   . Ulcers Neg Hx     Social History Social History   Tobacco Use  . Smoking status: Never Smoker  . Smokeless tobacco: Never Used  Substance Use Topics  . Alcohol use:  No  . Drug use: No     Allergies   Food   Review of Systems Review of Systems  Constitutional: Negative.   Genitourinary: Positive for penile pain.  All other systems reviewed and are negative.    Physical Exam Triage Vital Signs ED Triage Vitals  Enc Vitals Group     BP 04/11/19 1306 123/79     Pulse Rate 04/11/19 1306 64     Resp 04/11/19 1306 18     Temp 04/11/19 1306 98.1 F (36.7 C)     Temp Source 04/11/19 1306 Oral     SpO2 04/11/19 1306 97 %     Weight --      Height --      Head Circumference --      Peak Flow --      Pain Score 04/11/19 1307 10     Pain Loc --      Pain Edu? --      Excl. in [place]? --    No data found.  Updated Vital Signs BP 123/79 (BP Location: Left Arm)   Pulse 64   Temp 98.1 F (36.7 C) (Oral)   Resp 18   SpO2 97%    Physical Exam Vitals signs and nursing note  reviewed.  Constitutional:      Appearance: Normal appearance. He is normal weight.  HENT:     Head: Normocephalic.  Eyes:     Conjunctiva/sclera: Conjunctivae normal.  Neck:     Musculoskeletal: Normal range of motion and neck supple.  Cardiovascular:     Rate and Rhythm: Normal rate.  Pulmonary:     Effort: Pulmonary effort is normal.  Genitourinary:    Comments: Priapism Small testes No inguinal adenopathy No scrotal tenderness. Musculoskeletal: Normal range of motion.  Skin:    General: Skin is warm and dry.  Neurological:     General: No focal deficit present.     Mental Status: He is alert and oriented to person, place, and time.  Psychiatric:        Mood and Affect: Mood normal.        Behavior: Behavior normal.        Thought Content: Thought content normal.        Judgment: Judgment normal.      UC Treatments / Results  Labs (all labs ordered are listed, but only abnormal results are displayed) Labs Reviewed - No data to display  EKG None  Radiology No results found.  Procedures Procedures (including critical care time)   Medications Ordered in UC Medications  ketorolac (TORADOL) injection 60 mg (has no administration in time range)    Initial Impression / Assessment and Plan / UC Course  I have reviewed the triage vital signs and the nursing notes.  Pertinent labs & imaging results that were available during my care of the patient were reviewed by me and considered in my medical decision making (see chart for details).     Final Clinical Impressions(s) / UC Diagnoses   Final diagnoses:  Priapism   Discharge Instructions   None    ED Prescriptions    None     Controlled Substance Prescriptions [place] Controlled Substance Registry consulted? Not Applicable   Elvina SidleLauenstein, Rainbow Salman, MD 04/11/19 1327

## 2019-07-05 ENCOUNTER — Encounter: Payer: Self-pay | Admitting: Family Medicine

## 2019-07-05 ENCOUNTER — Telehealth (INDEPENDENT_AMBULATORY_CARE_PROVIDER_SITE_OTHER): Payer: 59 | Admitting: Family Medicine

## 2019-07-05 DIAGNOSIS — J018 Other acute sinusitis: Secondary | ICD-10-CM

## 2019-07-05 MED ORDER — FLUTICASONE PROPIONATE 50 MCG/ACT NA SUSP: 2.0000 | Freq: Every day | NASAL | 0 refills | Status: DC

## 2019-07-05 MED ORDER — AMOXICILLIN 500 MG PO CAPS: 500.0000 mg | ORAL_CAPSULE | Freq: Three times a day (TID) | ORAL | 0 refills | Status: DC

## 2019-07-05 NOTE — Progress Notes (Signed)
Virtual Visit via Video Note  I connected with Chad Simmons, on 07/05/2019 at 9:13 AM by video enabled telemedicine device due to the COVID-19 pandemic and verified that I am speaking with the correct person using two identifiers.   Consent: I discussed the limitations, risks, security and privacy concerns of performing an evaluation and management service by telemedicine and the availability of in person appointments. I also discussed with the patient that there may be a patient responsible charge related to this service. The patient expressed understanding and agreed to proceed.   Location of Patient: Home  Location of Provider: Clinic   Persons participating in Telemedicine visit: Chad Simmons [place]-CMA Dr. Felecia Shelling     History of Present Illness: Chad Simmons is a 23 year old male with a history of Asthma who presents for an acute visit complaining of a 2 day history of nasal congestion, dry cough, headache, post nasal drip, sore throat and voice hoarseness, fever of 100.8. He initially had facial pressure which has resolved. 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 He works in Teacher, adult education and they recently started using masks but do not wear it all the time.   Past Medical History:  Diagnosis Date  . Abdominal pain   . Asthma   . Constipation   . Decreased appetite   . Eczema   . Nausea   . Weight loss    Allergies  Allergen Reactions  . Food     Eggs, peanuts    Current Outpatient Medications on File Prior to Visit  Medication Sig Dispense Refill  . albuterol (VENTOLIN HFA) 108 (90 Base) MCG/ACT inhaler Inhale 2 puffs into the lungs every 4 (four) hours as needed for wheezing or shortness of breath (cough, shortness of breath or wheezing.). 1 Inhaler 1  . EPINEPHrine 0.3 mg/0.3 mL IJ SOAJ injection Inject 0.3 mg  into the muscle.     No current facility-administered medications on file prior to visit.     Observations/Objective: Awake, alert, oriented x3 Not in acute distress  Assessment and Plan: 1. Acute non-recurrent sinusitis of other sinus Advised to perform nasal irrigation, increase fluid intake, rest Will also need to exclude SARS-CoV-2 - amoxicillin (AMOXIL) 500 MG capsule; Take 1 capsule (500 mg total) by mouth 3 (three) times daily.  Dispense: 30 capsule; Refill: 0 - fluticasone (FLONASE) 50 MCG/ACT nasal spray; Place 2 sprays into both nostrils daily.  Dispense: 16 g; Refill: 0 - Novel Coronavirus, NAA ([facility]); Future   Follow Up Instructions: Return if symptoms worsen or fail to improve.    I discussed the assessment and treatment plan with the patient. The patient was provided an opportunity to ask questions and all were answered. The patient agreed with the plan and demonstrated an understanding of the instructions.   The patient was advised to call back or seek an in-person evaluation if the symptoms worsen or if the condition fails to improve as anticipated.     I provided 12 minutes total of Telehealth time during this encounter including median intraservice time, reviewing previous notes, labs, imaging, medications and explaining diagnosis and management.     Charlott Rakes, MD, FAAFP. [facility] and [place], [place]   07/05/2019, 9:13 AM

## 2019-07-05 NOTE — Progress Notes (Signed)
Patient has c/o headache, sore throat, hoarse voice, runny nose, postnasal drainage, cough x 2 days. No known sick contact. Took OTC cold medication with no relief.

## 2019-09-02 ENCOUNTER — Other Ambulatory Visit: Payer: Self-pay

## 2019-09-02 ENCOUNTER — Encounter: Payer: Self-pay | Admitting: Emergency Medicine

## 2019-09-02 ENCOUNTER — Ambulatory Visit
Admission: EM | Admit: 2019-09-02 | Discharge: 2019-09-02 | Disposition: A | Discharge status: Home / Self Care | Payer: 59 | Attending: Physician Assistant | Admitting: Physician Assistant

## 2019-09-02 DIAGNOSIS — T7840XA Allergy, unspecified, initial encounter: Secondary | ICD-10-CM | POA: Diagnosis not present

## 2019-09-02 DIAGNOSIS — H02843 Edema of right eye, unspecified eyelid: Secondary | ICD-10-CM

## 2019-09-02 MED ORDER — PREDNISONE 50 MG PO TABS: 50.0000 mg | ORAL_TABLET | Freq: Every day | ORAL | 0 refills | Status: DC

## 2019-09-02 NOTE — ED Notes (Signed)
Patient able to ambulate independently  

## 2019-09-02 NOTE — Discharge Instructions (Signed)
At this time you can monitor with ice compress, benadryl/allergy medicine. If symptoms worsens/does not improving through out the next 1-2 days, can stat prednisone for allergic reaction. If noticing eye redness/ changes in vision, follow up for reevaluation. If having shortness of breath, swelling of the throat/mouth, go to the ED for further evaluation needed.

## 2019-09-02 NOTE — ED Provider Notes (Signed)
[facility]    CSN: [id number] Arrival date & time: 09/02/19  1354      History   Chief Complaint Chief Complaint  Patient presents with  . Allergic Reaction    HPI Chad Simmons is a 23 y.o. male.   23 year old male comes in for evaluation of right eyelid swelling that started 1 hour prior to arrival.  Patient states allergies to peanut.  This was discovered through allergy testing, and has EpiPen for it.  He was putting out nut/seeds for birds. After, he rubbed his right eye and now with eyelid swelling.  He thinks there may be peanuts within the mix.  He denies eating any nuts.  Denies shortness of breath, oral swelling, difficulty swallowing.  Denies abdominal pain, nausea, vomiting, diarrhea.  Denies throat irritation.  Patient states at first there was itching to the eyelid, but has since resolved.  He denies any changes in vision, eye drainage, eye redness, photophobia.  Denies contact lens, glasses use.  He has not taken anything specifically for current symptoms, but does take daily antihistamine, for which he took this morning.     Past Medical History:  Diagnosis Date  . Abdominal pain   . Asthma   . Constipation   . Decreased appetite   . Eczema   . Nausea   . Weight loss     Patient Active Problem List   Diagnosis Date Noted  . Mild intermittent asthma without complication 10/12/2017  . Seasonal and perennial allergic rhinitis 10/12/2017  . 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 Muscle twitch 10/12/2017  . Periumbilical abdominal pain   . Nausea   . Decreased appetite   . Weight loss   . Unspecified constipation     History reviewed. No pertinent surgical history.     Home Medications    Prior to Admission medications   Medication Sig Start Date End Date Taking? Authorizing Provider  albuterol (VENTOLIN HFA) 108 (90 Base) MCG/ACT inhaler Inhale 2 puffs into the lungs every 4 (four) hours as needed for wheezing or shortness of  breath (cough, shortness of breath or wheezing.). 04/05/19   Bing Neighbors, FNP  EPINEPHrine 0.3 mg/0.3 mL IJ SOAJ injection Inject 0.3 mg into the muscle. 12/03/15   [provider]  fluticasone (FLONASE) 50 MCG/ACT nasal spray Place 2 sprays into both nostrils daily. 07/05/19   Hoy Register, MD  predniSONE (DELTASONE) 50 MG tablet Take 1 tablet (50 mg total) by mouth daily with breakfast. 09/02/19   Belinda Fisher, PA-C    Family History Family History  Problem Relation Age of Onset  . Irritable bowel syndrome Mother   . Cholelithiasis Neg Hx   . Ulcers Neg Hx     Social History Social History   Tobacco Use  . Smoking status: Never Smoker  . Smokeless tobacco: Never Used  Substance Use Topics  . Alcohol use: No  . Drug use: No     Allergies   Food   Review of Systems Review of Systems  Reason unable to perform ROS: See HPI as above.     Physical Exam Triage Vital Signs ED Triage Vitals  Enc Vitals Group     BP 09/02/19 1402 118/79     Pulse Rate 09/02/19 1402 71     Resp 09/02/19 1402 18     Temp 09/02/19 1402 97.6 F (36.4 C)     Temp Source 09/02/19 1402 Oral     SpO2 09/02/19 1402  97 %     Weight --      Height --      Head Circumference --      Peak Flow --      Pain Score 09/02/19 1401 0     Pain Loc --      Pain Edu? --      Excl. in [place]? --    No data found.  Updated Vital Signs BP 118/79 (BP Location: Left Arm)   Pulse 71   Temp 97.6 F (36.4 C) (Oral)   Resp 18   SpO2 97%   Physical Exam Constitutional:      General: He is not in acute distress.    Appearance: He is well-developed. He is not diaphoretic.  HENT:     Head: Normocephalic and atraumatic.     Mouth/Throat:     Mouth: Mucous membranes are moist.     Pharynx: Oropharynx is clear. Uvula midline.  Eyes:     Extraocular Movements: Extraocular movements intact.     Conjunctiva/sclera: Conjunctivae normal.     Pupils: Pupils are equal, round, and reactive to light.      Comments: Right upper and lower eyelid swelling without erythema, warmth, pain.  No rash to the face.  No photophobia.  Cardiovascular:     Rate and Rhythm: Normal rate and regular rhythm.  Pulmonary:     Effort: Pulmonary effort is normal. No respiratory distress.     Breath sounds: Normal breath sounds.  Neurological:     Mental Status: He is alert and oriented to person, place, and time.      UC Treatments / Results  Labs (all labs ordered are listed, but only abnormal results are displayed) Labs Reviewed - No data to display  EKG   Radiology No results found.  Procedures Procedures (including critical care time)  Medications Ordered in UC Medications - No data to display  Initial Impression / Assessment and Plan / UC Course  I have reviewed the triage vital signs and the nursing notes.  Pertinent labs & imaging results that were available during my care of the patient were reviewed by me and considered in my medical decision making (see chart for details).    No alarming signs on exam.  Patient states symptoms have been improving since onset.  Will have patient continue ice compress, antihistamine.  Rx of prednisone sent to pharmacy, can start if symptoms not improving, or worsening.  Return precautions given.  Patient expresses understanding and agrees to plan.  Final Clinical Impressions(s) / UC Diagnoses   Final diagnoses:  Swelling of right eyelid  Allergic reaction, initial encounter    ED Prescriptions    Medication Sig Dispense Auth. Provider   predniSONE (DELTASONE) 50 MG tablet Take 1 tablet (50 mg total) by mouth daily with breakfast. 5 tablet Ok Edwards, PA-C     PDMP not reviewed this encounter.   Ok Edwards, PA-C 09/02/19 1417

## 2019-09-02 NOTE — ED Triage Notes (Signed)
Pt presents to [facility] for assessment after touching some peanuts with his hands and then rubbing his eye.  Hx of allergy to peanuts.  Patient c/o right eye swelling, noted at triage.  Denies SOB, denies throat swelling/itchiness/difficulty swallowing.

## 2019-09-03 ENCOUNTER — Telehealth: Payer: Self-pay | Admitting: Emergency Medicine

## 2019-09-03 NOTE — Telephone Encounter (Signed)
Left voicemail checking in on patient, and encouraged return call with any continuing questions or concerns.    

## 2019-10-10 ENCOUNTER — Telehealth: Payer: Self-pay

## 2019-10-10 NOTE — Telephone Encounter (Signed)

## 2019-10-10 NOTE — Progress Notes (Deleted)
Subjective:    Patient ID: Chad Simmons, male    DOB: 15-Feb-1996, 23 y.o.   MRN: [id number]  07/2019  sinus Acute non-recurrent sinusitis of other sinus Advised to perform nasal irrigation, increase fluid intake, rest Will also need to exclude SARS-CoV-2 - amoxicillin (AMOXIL) 500 MG capsule; Take 1 capsule (500 mg total) by mouth 3 (three) times daily.  Dispense: 30 capsule; Refill: 0 - fluticasone (FLONASE) 50 MCG/ACT nasal spray; Place 2 sprays into both nostrils daily.  Dispense: 16 g; Refill: 0 - Novel Coronavirus, NAA ([facility]); Future  11/1 nut exposure feeding birds with eye swelling 2/20 Sleep hygiene discussed with the patient and patient would like to try a prescription medication to help with sleep. Rx provided for patient to take trazodone 50 mg at bedtime as well as using sleep hygiene measures. Patient does not seem to have factors suggestive of sleep apnea that would require referral for a sleep study at this time. Notify office/call or return if problems with the medication or inability to sleep - traZODone (DESYREL) 50 MG tablet; Take 0.5-1 tablets (25-50 mg total) by mouth at bedtime as needed for sleep.  Dispense: 30 tablet; Refill: 3    Past Medical History:  Diagnosis Date  . Abdominal pain   . Asthma   . Constipation   . Decreased appetite   . Eczema   . Nausea   . Weight loss      Family History  Problem Relation Age of Onset  . Irritable bowel syndrome Mother   . Cholelithiasis Neg Hx   . Ulcers Neg Hx      Social History   Socioeconomic History  . Marital status: Single    Spouse name: Not on file  . Number of children: Not on file  . Years of education: Not on file  . Highest education level: Not on file  Occupational History  . Not on file  Tobacco Use  . Smoking status: Never Smoker  . Smokeless tobacco: Never Used  Substance and Sexual Activity  . Alcohol use: No  . Drug use: No  . Sexual activity: Not on file  Other Topics  Concern  . Not on file  Social History Narrative   9th grade 2014-2015   Social Determinants of Health   Financial Resource Strain:   . Difficulty of Paying Living Expenses: Not on file  Food Insecurity:   . Worried About Charity fundraiser in the Last Year: Not on file  . Ran Out of Food in the Last Year: Not on file  Transportation Needs:   . Lack of Transportation (Medical): Not on file  . Lack of Transportation (Non-Medical): Not on file  Physical Activity:   . Days of Exercise per Week: Not on file  . Minutes of Exercise per Session: Not on file  Stress:   . Feeling of Stress : Not on file  Social Connections:   . Frequency of Communication with Friends and Family: Not on file  . Frequency of Social Gatherings with Friends and Family: Not on file  . Attends Religious Services: Not on file  . Active Member of Clubs or Organizations: Not on file  . Attends Archivist Meetings: Not on file  . Marital Status: Not on file  Intimate Partner Violence:   . Fear of Current or Ex-Partner: Not on file  . Emotionally Abused: Not on file  . Physically Abused: Not on file  . Sexually Abused: Not on file  Allergies  Allergen Reactions  . Food     Eggs, peanuts     Outpatient Medications Prior to Visit  Medication Sig Dispense Refill  . albuterol (VENTOLIN HFA) 108 (90 Base) MCG/ACT inhaler Inhale 2 puffs into the lungs every 4 (four) hours as needed for wheezing or shortness of breath (cough, shortness of breath or wheezing.). 1 Inhaler 1  . EPINEPHrine 0.3 mg/0.3 mL IJ SOAJ injection Inject 0.3 mg into the muscle.    . fluticasone (FLONASE) 50 MCG/ACT nasal spray Place 2 sprays into both nostrils daily. 16 g 0  . predniSONE (DELTASONE) 50 MG tablet Take 1 tablet (50 mg total) by mouth daily with breakfast. 5 tablet 0   No facility-administered medications prior to visit.    Review of Systems Constitutional:   No  weight loss, night sweats,  Fevers, chills,  fatigue, lassitude. HEENT:   No headaches,  Difficulty swallowing,  Tooth/dental problems,  Sore throat,                No sneezing, itching, ear ache, nasal congestion, post nasal drip,   CV:  No chest pain,  Orthopnea, PND, swelling in lower extremities, anasarca, dizziness, palpitations  GI  No heartburn, indigestion, abdominal pain, nausea, vomiting, diarrhea, change in bowel habits, loss of appetite  Resp: No shortness of breath with exertion or at rest.  No excess mucus, no productive cough,  No non-productive cough,  No coughing up of blood.  No change in color of mucus.  No wheezing.  No chest wall deformity  Skin: no rash or lesions.  GU: no dysuria, change in color of urine, no urgency or frequency.  No flank pain.  MS:  No joint pain or swelling.  No decreased range of motion.  No back pain.  Psych:  No change in mood or affect. No depression or anxiety.  No memory loss.     Objective:   Physical Exam There were no vitals filed for this visit.  Gen: Pleasant, well-nourished, in no distress,  normal affect  ENT: No lesions,  mouth clear,  oropharynx clear, no postnasal drip  Neck: No JVD, no TMG, no carotid bruits  Lungs: No use of accessory muscles, no dullness to percussion, clear without rales or rhonchi  Cardiovascular: RRR, heart sounds normal, no murmur or gallops, no peripheral edema  Abdomen: soft and NT, no HSM,  BS normal  Musculoskeletal: No deformities, no cyanosis or clubbing  Neuro: alert, non focal  Skin: Warm, no lesions or rashes  No results found.      Assessment & Plan:  I personally reviewed all images and lab data in the [facility] system as well as any outside material available during this office visit and agree with the  radiology impressions.   No problem-specific Assessment & Plan notes found for this encounter.   There are no diagnoses linked to this encounter.

## 2019-10-11 ENCOUNTER — Ambulatory Visit: Payer: 59

## 2020-01-24 ENCOUNTER — Telehealth: Payer: Self-pay

## 2020-01-24 NOTE — Telephone Encounter (Signed)
Called patient to do their pre-visit COVID screening.  Call went to voicemail. Unable to do prescreening.  

## 2020-01-24 NOTE — Patient Instructions (Signed)
Thank you for choosing Primary Care at [facility] to be your medical home!    Chad Simmons was seen by De Hollingshead, DO today.   Chad Simmons primary care provider is Chad Siren, DO.   For the best care possible, you should try to see Chad Siren, DO whenever you come to the clinic.   We look forward to seeing you again soon!  If you have any questions about your visit today, please call us at [phone] or feel free to reach your primary care provider via MyChart.     Keeping you healthy  Get these tests  Blood pressure- Have your blood pressure checked once a year by your healthcare provider.  Normal blood pressure is 120/80.  Weight- Have your body mass index (BMI) calculated to screen for obesity.  BMI is a measure of body fat based on height and weight. You can also calculate your own BMI at https://www.west-esparza.com/.  Cholesterol- Have your cholesterol checked regularly starting at age 56, sooner may be necessary if you have diabetes, high blood pressure, if a family member developed heart diseases at an early age or if you smoke.   Chlamydia, HIV, and other sexual transmitted disease- Get screened each year until the age of 68 then within three months of each new sexual partner.  Diabetes- Have your blood sugar checked regularly if you have high blood pressure, high cholesterol, a family history of diabetes or if you are overweight.  Get these vaccines  Flu shot- Every fall.  Tetanus shot- Every 10 years.  Menactra- Single dose; prevents meningitis.  Take these steps  Don't smoke- If you do smoke, ask your healthcare provider about quitting. For tips on how to quit, go to www.smokefree.gov or call 1-800-QUIT-NOW.  Be physically active- Exercise 5 days a week for at least 30 minutes.  If you are not already physically active start slow and gradually work up to 30 minutes of moderate physical activity.  Examples of moderate activity include  walking briskly, mowing the yard, dancing, swimming bicycling, etc.  Eat a healthy diet- Eat a variety of healthy foods such as fruits, vegetables, low fat milk, low fat cheese, yogurt, lean meats, poultry, fish, beans, tofu, etc.  For more information on healthy eating, go to www.thenutritionsource.org  Drink alcohol in moderation- Limit alcohol intake two drinks or less a day.  Never drink and drive.  Dentist- Brush and floss teeth twice daily; visit your dentis twice a year.  Depression-Your emotional health is as important as your physical health.  If you're feeling down, losing interest in things you normally enjoy please talk with your healthcare provider.  Gun Safety- If you keep a gun in your home, keep it unloaded and with the safety lock on.  Bullets should be stored separately.  Helmet use- Always wear a helmet when riding a motorcycle, bicycle, rollerblading or skateboarding.  Safe sex- If you may be exposed to a sexually transmitted infection, use a condom  Seat belts- Seat bels can save your life; always wear one.  Smoke/Carbon Monoxide detectors- These detectors need to be installed on the appropriate level of your home.  Replace batteries at least once a year.  Skin Cancer- When out in the sun, cover up and use sunscreen SPF 15 or higher.  Violence- If anyone is threatening or hurting you, please tell your healthcare provider.

## 2020-01-25 ENCOUNTER — Encounter: Payer: Self-pay | Admitting: Internal Medicine

## 2020-01-25 ENCOUNTER — Other Ambulatory Visit: Payer: Self-pay

## 2020-01-25 ENCOUNTER — Ambulatory Visit (INDEPENDENT_AMBULATORY_CARE_PROVIDER_SITE_OTHER): Payer: 59 | Admitting: Internal Medicine

## 2020-01-25 VITALS — BP 130/72 | HR 67 | Temp 97.3°F | Resp 17 | Ht 68.0 in | Wt 178.0 lb

## 2020-01-25 DIAGNOSIS — Z114 Encounter for screening for human immunodeficiency virus [HIV]: Secondary | ICD-10-CM | POA: Diagnosis not present

## 2020-01-25 DIAGNOSIS — M545 Low back pain, unspecified: Secondary | ICD-10-CM

## 2020-01-25 DIAGNOSIS — J4520 Mild intermittent asthma, uncomplicated: Secondary | ICD-10-CM | POA: Diagnosis not present

## 2020-01-25 DIAGNOSIS — Z0000 Encounter for general adult medical examination without abnormal findings: Secondary | ICD-10-CM | POA: Diagnosis not present

## 2020-01-25 DIAGNOSIS — G8929 Other chronic pain: Secondary | ICD-10-CM

## 2020-01-25 MED ORDER — ALBUTEROL SULFATE HFA 108 (90 BASE) MCG/ACT IN AERS: 2.0000 | INHALATION_SPRAY | RESPIRATORY_TRACT | 5 refills | Status: DC | PRN

## 2020-01-25 NOTE — Progress Notes (Signed)
  Subjective:    Chad Simmons - 24 y.o. male MRN [id number]  Date of birth: Mar 25, 1996  HPI  Chad Simmons is here for annual exam. No concerns today. Has chronic low back pain after injury in gym weight lifting. Followed by [facility]. Has been doing PT without any improvement. MRI scheduled for April. He declines STD testing. 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      Health Maintenance:  Health Maintenance Due  Topic Date Due  . HIV Screening  Never done    -  reports that he has never smoked. He has never used smokeless tobacco. - Review of Systems: Per HPI. - Past Medical History: Patient Active Problem List   Diagnosis Date Noted  . Mild intermittent asthma without complication 10/12/2017  . Seasonal and perennial allergic rhinitis 10/12/2017  . 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  HENT:  Head: Normocephalic and atraumatic.  Mouth/Throat: Oropharynx is clear and moist.  Eyes: Pupils are equal, round, and reactive to light. Conjunctivae and EOM are normal.  Neck: No thyromegaly present.  Cardiovascular: Normal rate, regular rhythm, normal heart sounds and intact distal pulses.  No murmur heard. Pulmonary/Chest: Effort normal and breath sounds normal. No respiratory distress. He has no wheezes.  Abdominal: Soft. Bowel sounds are normal. He exhibits no distension. There is no abdominal tenderness. There is no rebound and no guarding.  Musculoskeletal:        General: No deformity or edema. Normal range of motion.     Cervical back: Normal range of motion and neck supple.   Lymphadenopathy:    He has no cervical adenopathy.  Neurological: He is alert and oriented to person, place, and time. Gait normal.  Skin: Skin is warm and dry. No rash noted. He is not diaphoretic.  Psychiatric: Mood, affect and judgment normal.           Assessment & Plan:    1. Annual physical exam - CBC with Differential - Comprehensive metabolic panel  2. Mild intermittent asthma without complication Well controlled and stable. Lungs clear.  - albuterol (VENTOLIN HFA) 108 (90 Base) MCG/ACT inhaler; Inhale 2 puffs into the lungs every 4 (four) hours as needed for wheezing or shortness of breath (cough, shortness of breath or wheezing.).  Dispense: 18 g; Refill: 5  3. Chronic left-sided low back pain without sciatica Followed by ortho and PT. MRI planned.   4. Screening for HIV (human immunodeficiency virus) - HIV antibody (with reflex)   , D.O. 01/25/2020, 10:15 AM Primary Care at [facility]

## 2020-01-26 LAB — CBC WITH DIFFERENTIAL/PLATELET
Basophils Absolute: 0.1 10*3/uL (ref 0.0–0.2)
Basos: 1 %
EOS (ABSOLUTE): 0.3 10*3/uL (ref 0.0–0.4)
Eos: 6 %
Hematocrit: 45.3 % (ref 37.5–51.0)
Hemoglobin: 14.5 g/dL (ref 13.0–17.7)
Immature Grans (Abs): 0 10*3/uL (ref 0.0–0.1)
Immature Granulocytes: 0 %
Lymphocytes Absolute: 2.2 10*3/uL (ref 0.7–3.1)
Lymphs: 52 %
MCH: 26.6 pg (ref 26.6–33.0)
MCHC: 32 g/dL (ref 31.5–35.7)
MCV: 83 fL (ref 79–97)
Monocytes Absolute: 0.3 10*3/uL (ref 0.1–0.9)
Monocytes: 7 %
Neutrophils Absolute: 1.5 10*3/uL (ref 1.4–7.0)
Neutrophils: 34 %
Platelets: 223 10*3/uL (ref 150–450)
RBC: 5.46 x10E6/uL (ref 4.14–5.80)
RDW: 12.9 % (ref 11.6–15.4)
WBC: 4.3 10*3/uL (ref 3.4–10.8)

## 2020-01-26 LAB — COMPREHENSIVE METABOLIC PANEL
ALT: 18 IU/L (ref 0–44)
AST: 27 IU/L (ref 0–40)
Albumin/Globulin Ratio: 1.7 (ref 1.2–2.2)
Albumin: 4.8 g/dL (ref 4.1–5.2)
Alkaline Phosphatase: 77 IU/L (ref 39–117)
BUN/Creatinine Ratio: 13 (ref 9–20)
BUN: 16 mg/dL (ref 6–20)
Bilirubin Total: 0.3 mg/dL (ref 0.0–1.2)
CO2: 20 mmol/L (ref 20–29)
Calcium: 9.8 mg/dL (ref 8.7–10.2)
Chloride: 104 mmol/L (ref 96–106)
Creatinine, Ser: 1.22 mg/dL (ref 0.76–1.27)
GFR calc Af Amer: 97 mL/min/{1.73_m2} (ref >59)
GFR calc non Af Amer: 84 mL/min/{1.73_m2} (ref >59)
Globulin, Total: 2.8 g/dL (ref 1.5–4.5)
Glucose: 101 mg/dL — ABNORMAL HIGH (ref 65–99)
Potassium: 4.7 mmol/L (ref 3.5–5.2)
Sodium: 139 mmol/L (ref 134–144)
Total Protein: 7.6 g/dL (ref 6.0–8.5)

## 2020-01-26 LAB — HIV ANTIBODY (ROUTINE TESTING W REFLEX): HIV Screen 4th Generation wRfx: NONREACTIVE

## 2020-01-28 NOTE — Progress Notes (Signed)
Patient notified of results & recommendations. Expressed understanding.

## 2020-07-28 ENCOUNTER — Other Ambulatory Visit: Payer: Self-pay

## 2020-07-28 ENCOUNTER — Ambulatory Visit
Admission: EM | Admit: 2020-07-28 | Discharge: 2020-07-28 | Disposition: A | Discharge status: Home / Self Care | Payer: 59 | Attending: Physician Assistant | Admitting: Physician Assistant

## 2020-07-28 DIAGNOSIS — R21 Rash and other nonspecific skin eruption: Secondary | ICD-10-CM

## 2020-07-28 MED ORDER — DESONIDE 0.05 % EX CREA: TOPICAL_CREAM | Freq: Two times a day (BID) | CUTANEOUS | 0 refills | Status: DC

## 2020-07-28 NOTE — Discharge Instructions (Signed)
Start desonide as directed. Follow up with dermatologist if symptoms not improving.

## 2020-07-28 NOTE — ED Provider Notes (Signed)
[facility]    CSN: [id number] Arrival date & time: 07/28/20  1617      History   Chief Complaint Chief Complaint  Patient presents with  . dry skin    HPI Chad Simmons is a 24 y.o. male.   24 year old male comes in for 2-3 week history of rash/dry skin to the corner of left eye. Area can be tender to palpation. Denies any redness, warmth, drainage. Does see dermatologist for acne, and started using clindamycin wash to the face 3 weeks ago. Has tried shea butter with some relief.      Past Medical History:  Diagnosis Date  . Abdominal pain   . Asthma   . Constipation   . Decreased appetite   . Eczema   . Nausea   . Weight loss     Patient Active Problem List   Diagnosis Date Noted  . Mild intermittent asthma without complication 10/12/2017  . Seasonal and perennial allergic rhinitis 10/12/2017  . 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 No pertinent surgical history.     Home Medications    Prior to Admission medications   Medication Sig Start Date End Date Taking? Authorizing Provider  albuterol (VENTOLIN HFA) 108 (90 Base) MCG/ACT inhaler Inhale 2 puffs into the lungs every 4 (four) hours as needed for wheezing or shortness of breath (cough, shortness of breath or wheezing.). 01/25/20   Arvilla Market, DO  cetirizine (ZYRTEC) 10 MG tablet Take 10 mg by mouth daily.    [provider]  desonide (DESOWEN) 0.05 % cream Apply topically 2 (two) times daily. Use sparingly to the affected area 07/28/20   Cathie Hoops, Mckensi Redinger V, PA-C  EPINEPHrine 0.3 mg/0.3 mL IJ SOAJ injection Inject 0.3 mg into the muscle. 12/03/15   [provider]    Family History Family History  Problem Relation Age of Onset  . Irritable bowel syndrome Mother   . Cholelithiasis Neg Hx   . Ulcers Neg Hx     Social History Social History   Tobacco Use  . Smoking status: Never Smoker  . Smokeless tobacco: Never Used  Vaping Use  .  Vaping Use: Never used  Substance Use Topics  . Alcohol use: No  . Drug use: No     Allergies   Peanut-containing drug products and Egg [eggs or egg-derived products]   Review of Systems Review of Systems  Reason unable to perform ROS: See HPI as above.     Physical Exam Triage Vital Signs ED Triage Vitals  Enc Vitals Group     BP 07/28/20 1718 130/72     Pulse Rate 07/28/20 1718 (!) 58     Resp 07/28/20 1718 16     Temp 07/28/20 1718 98.4 F (36.9 C)     Temp Source 07/28/20 1718 Oral     SpO2 07/28/20 1718 98 %     Weight --      Height --      Head Circumference --      Peak Flow --      Pain Score 07/28/20 1736 0     Pain Loc --      Pain Edu? --      Excl. in GC? --    No data found.  Updated Vital Signs BP 130/72 (BP Location: Right Arm)   Pulse (!) 58   Temp 98.4 F (36.9 C) (Oral)   Resp 16   SpO2 98%  Physical Exam Constitutional:      General: He is not in acute distress.    Appearance: Normal appearance. He is well-developed. He is not toxic-appearing or diaphoretic.  HENT:     Head: Normocephalic and atraumatic.   Eyes:     Extraocular Movements: Extraocular movements intact.     Conjunctiva/sclera: Conjunctivae normal.     Pupils: Pupils are equal, round, and reactive to light.  Pulmonary:     Effort: Pulmonary effort is normal. No respiratory distress.  Musculoskeletal:     Cervical back: Normal range of motion and neck supple.  Skin:    General: Skin is warm and dry.  Neurological:     Mental Status: He is alert and oriented to person, place, and time.      UC Treatments / Results  Labs (all labs ordered are listed, but only abnormal results are displayed) Labs Reviewed - No data to display  EKG   Radiology No results found.  Procedures Procedures (including critical care time)  Medications Ordered in UC Medications - No data to display  Initial Impression / Assessment and Plan / UC Course  I have reviewed the  triage vital signs and the nursing notes.  Pertinent labs & imaging results that were available during my care of the patient were reviewed by me and considered in my medical decision making (see chart for details).    ?dermatitis. Will have patient refrain from using clindamycin to surrounding area of current rash. Avoid using hygiene product surrounding of eye. Desonide sparingly. Discontinue in 1 week if not improving. Follow up with dermatologist if not improving. Return precautions given.  Final Clinical Impressions(s) / UC Diagnoses   Final diagnoses:  Rash of face    ED Prescriptions    Medication Sig Dispense Auth. Provider   desonide (DESOWEN) 0.05 % cream Apply topically 2 (two) times daily. Use sparingly to the affected area 30 g Belinda Fisher, PA-C     PDMP not reviewed this encounter.   Belinda Fisher, PA-C 07/28/20 1816

## 2020-07-28 NOTE — ED Triage Notes (Signed)
Pt c/o dry skin to the corner of lt eye x2-3 wks. States put shea butter on it with some relief.

## 2020-11-08 ENCOUNTER — Other Ambulatory Visit: Payer: 59

## 2021-04-22 ENCOUNTER — Encounter: Payer: Self-pay | Admitting: Family

## 2021-04-30 ENCOUNTER — Ambulatory Visit: Payer: 59 | Admitting: Physician Assistant

## 2021-04-30 ENCOUNTER — Encounter: Payer: Self-pay | Admitting: Physician Assistant

## 2021-04-30 ENCOUNTER — Other Ambulatory Visit: Payer: Self-pay

## 2021-04-30 VITALS — BP 148/88 | HR 68 | Temp 98.5°F | Resp 18 | Ht 68.0 in | Wt 169.0 lb

## 2021-04-30 DIAGNOSIS — R194 Change in bowel habit: Secondary | ICD-10-CM | POA: Diagnosis not present

## 2021-04-30 DIAGNOSIS — R1084 Generalized abdominal pain: Secondary | ICD-10-CM

## 2021-04-30 DIAGNOSIS — R252 Cramp and spasm: Secondary | ICD-10-CM

## 2021-04-30 DIAGNOSIS — R634 Abnormal weight loss: Secondary | ICD-10-CM

## 2021-04-30 MED ORDER — OMEPRAZOLE 20 MG PO CPDR: 20.0000 mg | DELAYED_RELEASE_CAPSULE | Freq: Every day | ORAL | 3 refills | Status: DC

## 2021-04-30 NOTE — Patient Instructions (Addendum)
I encourage you to increase your potassium intake at this time to help with your muscle cramps.  I would like you to start taking Prilosec on a daily basis, to help with the abdominal discomfort and hopefully slow down the increased bowel movements.  I sent the prescription to your pharmacy.  We will call you with your lab results and x-ray results.  Roney Jaffe, PA-C Physician Assistant [facility] [email or web address]   Muscle Cramps and Spasms Muscle cramps and spasms occur when a muscle or muscles tighten and you have no control over this tightening (involuntary muscle contraction). They are a common problem and can develop in any muscle. The most common place is in the calf muscles of the leg. Muscle cramps and muscle spasms are both involuntary muscle contractions, but there are some differences between the two: Muscle cramps are painful. They come and go and may last for a few seconds or up to 15 minutes. Muscle cramps are often more forceful and last longer than muscle spasms. Muscle spasms may or may not be painful. They may also last just a few seconds or much longer. Certain medical conditions, such as diabetes or Parkinson's disease, can make it more likely to develop cramps or spasms. However, cramps or spasms are usually not caused by a serious underlying problem. Common causes include: Doing more physical work or exercise than your body is ready for (overexertion). Overuse from repeating certain movements too many times. Remaining in a certain position for a long period of time. Improper preparation, form, or technique while playing a sport or doing an activity. Dehydration. Injury. Side effects of some medicines. Abnormally low levels of the salts and minerals in your blood (electrolytes), especially potassium and calcium. This could happen if you are taking water pills (diuretics) or if you are pregnant. In many cases,  the cause of muscle cramps or spasms is not known. Follow these instructions at home: Managing pain and stiffness     Try massaging, stretching, and relaxing the affected muscle. Do this for several minutes at a time. If directed, apply heat to tight or tense muscles as often as told by your health care provider. Use the heat source that your health care provider recommends, such as a moist heat pack or a heating pad. Place a towel between your skin and the heat source. Leave the heat on for 20-30 minutes. Remove the heat if your skin turns bright red. This is especially important if you are unable to feel pain, heat, or cold. You may have a greater risk of getting burned. If directed, put ice on the affected area. This may help if you are sore or have pain after a cramp or spasm. Put ice in a plastic bag. Place a towel between your skin and the bag. Leave the ice on for 20 minutes, 2-3 times a day. Try taking hot showers or baths to help relax tight muscles. Eating and drinking Drink enough fluid to keep your urine pale yellow. Staying well hydrated may help prevent cramps or spasms. Eat a healthy diet that includes plenty of nutrients to help your muscles function. A healthy diet includes fruits and vegetables, lean protein, whole grains, and low-fat or nonfat dairy products. General instructions If you are having frequent cramps, avoid intense exercise for several days. Take over-the-counter and prescription medicines only as told by your health care provider. Pay attention to any changes in your symptoms. Keep all follow-up visits as told by your health  care provider. This is important. Contact a health care provider if: Your cramps or spasms get more severe or happen more often. Your cramps or spasms do not improve over time. Summary Muscle cramps and spasms occur when a muscle or muscles tighten and you have no control over this tightening (involuntary muscle contraction). The most  common place for cramps or spasms to occur is in the calf muscles of the leg. Massaging, stretching, and relaxing the affected muscle may relieve the cramp or spasm. Drink enough fluid to keep your urine pale yellow. Staying well hydrated may help prevent cramps or spasms. This information is not intended to replace advice given to you by your health care provider. Make sure you discuss any questions you have with your healthcare provider. Document Revised: 03/13/2018 Document Reviewed: 03/13/2018 Elsevier Patient Education  2022 [employer].

## 2021-04-30 NOTE — Progress Notes (Signed)
Established Patient Office Visit  Subjective:  Patient ID: Chad Simmons, male    DOB: [birth year]  Age: 25 y.o. MRN: [id number]  CC:  Chief Complaint  Patient presents with   Weight Loss    HPI Quantez Schnyder reports that he has had approximately 20 pound weight loss in the last month despite eating a substantial amount of food.  Reports that he also has had increased bowel movements.  Reports in the past 2 weeks he has had 4-5 bowel movements a day.  Reports that he will have some abdominal discomfort which will be followed by a bowel movement which resolves the discomfort.  Reports the bowel movement will be loose if he ate recently otherwise it will be normal soft formed.  Denies any nighttime awakenings.  Reports that he has recently started having muscle cramps in his arms after working out.    Reports that he does go to the gym frequently, drinks plenty of water, avoids sodas.  Reports that he eats a well-balanced substantial diet.  Denies any family history of autoimmune disorders      Past Medical History:  Diagnosis Date   Abdominal pain    Asthma    Constipation    Decreased appetite    Eczema    Nausea    Weight loss     History reviewed. No pertinent surgical history.  Family History  Problem Relation Age of Onset   Irritable bowel syndrome Mother    Cholelithiasis Neg Hx    Ulcers Neg Hx     Social History   Socioeconomic History   Marital status: Single    Spouse name: Not on file   Number of children: Not on file   Years of education: Not on file   Highest education level: Not on file  Occupational History   Not on file  Tobacco Use   Smoking status: Never   Smokeless tobacco: Never  Vaping Use   Vaping Use: Never used  Substance and Sexual Activity   Alcohol use: No   Drug use: No   Sexual activity: Yes  Other Topics Concern   Not on file  Social History Narrative   9th grade 2014-2015   Social Determinants of Health   Financial  Resource Strain: Not on file  Food Insecurity: Not on file  Transportation Needs: Not on file  Physical Activity: Not on file  Stress: Not on file  Social Connections: Not on file  Intimate Partner Violence: Not on file    Outpatient Medications Prior to Visit  Medication Sig Dispense Refill   albuterol (VENTOLIN HFA) 108 (90 Base) MCG/ACT inhaler Inhale 2 puffs into the lungs every 4 (four) hours as needed for wheezing or shortness of breath (cough, shortness of breath or wheezing.). 18 g 5   cetirizine (ZYRTEC) 10 MG tablet Take 10 mg by mouth daily.     desonide (DESOWEN) 0.05 % cream Apply topically 2 (two) times daily. Use sparingly to the affected area 30 g 0   EPINEPHrine 0.3 mg/0.3 mL IJ SOAJ injection Inject 0.3 mg into the muscle.     No facility-administered medications prior to visit.    Allergies  Allergen Reactions   Peanut-Containing Drug Products Anaphylaxis   Egg [Eggs Or Egg-Derived Products]     ROS Review of Systems  Constitutional:  Positive for unexpected weight change. Negative for activity change, appetite change and fatigue.  HENT: Negative.    Eyes: Negative.   Respiratory:  Negative for shortness  of breath.   Cardiovascular:  Negative for chest pain.  Gastrointestinal:  Positive for abdominal pain. Negative for blood in stool and nausea.  Endocrine: Negative.   Genitourinary: Negative.   Musculoskeletal:  Positive for myalgias.  Skin: Negative.   Allergic/Immunologic: Negative.   Neurological: Negative.   Hematological: Negative.   Psychiatric/Behavioral: Negative.       Objective:    Physical Exam Vitals and nursing note reviewed.  Constitutional:      Appearance: Normal appearance.  HENT:     Head: Normocephalic and atraumatic.     Right Ear: External ear normal.     Left Ear: External ear normal.     Nose: Nose normal.     Mouth/Throat:     Mouth: Mucous membranes are moist.     Pharynx: Oropharynx is clear.  Eyes:      Extraocular Movements: Extraocular movements intact.     Conjunctiva/sclera: Conjunctivae normal.     Pupils: Pupils are equal, round, and reactive to light.  Cardiovascular:     Rate and Rhythm: Normal rate and regular rhythm.     Pulses: Normal pulses.     Heart sounds: Normal heart sounds.  Pulmonary:     Effort: Pulmonary effort is normal.     Breath sounds: Normal breath sounds.  Abdominal:     General: Abdomen is flat.     Palpations: Abdomen is soft.     Tenderness: There is no abdominal tenderness.  Musculoskeletal:        General: Normal range of motion.     Cervical back: Normal range of motion and neck supple.  Skin:    General: Skin is warm and dry.  Neurological:     General: No focal deficit present.     Mental Status: He is alert and oriented to person, place, and time.  Psychiatric:        Mood and Affect: Mood normal.        Behavior: Behavior normal.        Thought Content: Thought content normal.        Judgment: Judgment normal.    BP (!) 148/88 (BP Location: Right Arm, Patient Position: Sitting, Cuff Size: Normal)   Pulse 68   Temp 98.5 F (36.9 C) (Oral)   Resp 18   Ht 5\' 8"  (1.727 m)   Wt 169 lb (76.7 kg)   SpO2 98%   BMI 25.70 kg/m  Wt Readings from Last 3 Encounters:  04/30/21 169 lb (76.7 kg)  01/25/20 178 lb (80.7 kg)  04/05/19 167 lb 9.6 oz (76 kg)     Health Maintenance Due  Topic Date Due   HPV VACCINES (2 - Male 3-dose series) 12/31/2015   Hepatitis C Screening  Never done   TETANUS/TDAP  05/21/2020       Topic Date Due   HPV VACCINES (2 - Male 3-dose series) 12/31/2015    No results found for: TSH Lab Results  Component Value Date   WBC 4.3 01/25/2020   HGB 14.5 01/25/2020   HCT 45.3 01/25/2020   MCV 83 01/25/2020   PLT 223 01/25/2020   Lab Results  Component Value Date   NA 139 01/25/2020   K 4.7 01/25/2020   CO2 20 01/25/2020   GLUCOSE 101 (H) 01/25/2020   BUN 16 01/25/2020   CREATININE 1.22 01/25/2020    BILITOT 0.3 01/25/2020   ALKPHOS 77 01/25/2020   AST 27 01/25/2020   ALT 18 01/25/2020   PROT 7.6  01/25/2020   ALBUMIN 4.8 01/25/2020   CALCIUM 9.8 01/25/2020   No results found for: CHOL No results found for: HDL No results found for: LDLCALC No results found for: TRIG No results found for: CHOLHDL No results found for: XAJO8N    Assessment & Plan:   Problem List Items Addressed This Visit   None Visit Diagnoses     Recent unexplained weight loss    -  Primary   Relevant Orders   DG Abd 2 Views   CBC with Differential/Platelet   Comp. Metabolic Panel (12)   TSH   Generalized abdominal pain       Relevant Medications   omeprazole (PRILOSEC) 20 MG capsule   Other Relevant Orders   DG Abd 2 Views   Frequent bowel movements       Muscle cramps         1. Recent unexplained weight loss 18 pound weight loss documented through epic, consider referral to gastroenterology due to recent onset of increased loose stools.  P - DG Abd 2 Views; Future - CBC with Differential/Platelet - Comp. Metabolic Panel (12) - TSH  2. Generalized abdominal pain Trial Prilosec - DG Abd 2 Views; Future - omeprazole (PRILOSEC) 20 MG capsule; Take 1 capsule (20 mg total) by mouth daily.  Dispense: 30 capsule; Refill: 3  3. Frequent bowel movements   4. Muscle cramps Patient education given on increasing potassium   I have reviewed the patient's medical history (PMH, PSH, Social History, Family History, Medications, and allergies) , and have been updated if relevant. I spent 33 minutes reviewing chart and  face to face time with patient.     Meds ordered this encounter  Medications   omeprazole (PRILOSEC) 20 MG capsule    Sig: Take 1 capsule (20 mg total) by mouth daily.    Dispense:  30 capsule    Refill:  3    Order Specific Question:   Supervising Provider    Answer:   Storm Frisk [[id number]]    Follow-up: Return if symptoms worsen or fail to improve.    Kasandra Knudsen Mayers,  PA-C

## 2021-04-30 NOTE — Progress Notes (Signed)
Patient has not eaten or taken medication today. Patient denies pain at this time but does report intermittent stomach discomfort over the past month. Patient shares he weighs himself 2-3 days out the week and has noticed his weight dropping over the past month. Patient denies any changes in diet: Typical Diet as follows: AM: 4 boiled eggs + oatmeal LUNCH: Fast food pickup PM: Chicken + rice + veggie

## 2021-05-01 DIAGNOSIS — R194 Change in bowel habit: Secondary | ICD-10-CM | POA: Insufficient documentation

## 2021-05-01 DIAGNOSIS — R1084 Generalized abdominal pain: Secondary | ICD-10-CM | POA: Insufficient documentation

## 2021-05-01 LAB — COMP. METABOLIC PANEL (12)
AST: 27 IU/L (ref 0–40)
Albumin/Globulin Ratio: 1.8 (ref 1.2–2.2)
Albumin: 4.6 g/dL (ref 4.1–5.2)
Alkaline Phosphatase: 68 IU/L (ref 44–121)
BUN/Creatinine Ratio: 16 (ref 9–20)
BUN: 19 mg/dL (ref 6–20)
Bilirubin Total: 0.5 mg/dL (ref 0.0–1.2)
Calcium: 9.3 mg/dL (ref 8.7–10.2)
Chloride: 100 mmol/L (ref 96–106)
Creatinine, Ser: 1.17 mg/dL (ref 0.76–1.27)
Globulin, Total: 2.6 g/dL (ref 1.5–4.5)
Glucose: 91 mg/dL (ref 65–99)
Potassium: 4.3 mmol/L (ref 3.5–5.2)
Sodium: 138 mmol/L (ref 134–144)
Total Protein: 7.2 g/dL (ref 6.0–8.5)
eGFR: 90 mL/min/{1.73_m2} (ref >59)

## 2021-05-01 LAB — CBC WITH DIFFERENTIAL/PLATELET
Basophils Absolute: 0 10*3/uL (ref 0.0–0.2)
Basos: 1 %
EOS (ABSOLUTE): 0 10*3/uL (ref 0.0–0.4)
Eos: 1 %
Hematocrit: 43.4 % (ref 37.5–51.0)
Hemoglobin: 14.4 g/dL (ref 13.0–17.7)
Immature Grans (Abs): 0 10*3/uL (ref 0.0–0.1)
Immature Granulocytes: 0 %
Lymphocytes Absolute: 2.3 10*3/uL (ref 0.7–3.1)
Lymphs: 44 %
MCH: 28.1 pg (ref 26.6–33.0)
MCHC: 33.2 g/dL (ref 31.5–35.7)
MCV: 85 fL (ref 79–97)
Monocytes Absolute: 0.3 10*3/uL (ref 0.1–0.9)
Monocytes: 7 %
Neutrophils Absolute: 2.5 10*3/uL (ref 1.4–7.0)
Neutrophils: 47 %
Platelets: 202 10*3/uL (ref 150–450)
RBC: 5.12 x10E6/uL (ref 4.14–5.80)
RDW: 13.4 % (ref 11.6–15.4)
WBC: 5.2 10*3/uL (ref 3.4–10.8)

## 2021-05-01 LAB — TSH: TSH: 1.03 u[IU]/mL (ref 0.450–4.500)

## 2021-05-06 ENCOUNTER — Telehealth: Payer: Self-pay | Admitting: *Deleted

## 2021-05-06 NOTE — Telephone Encounter (Signed)
-----   Message from Roney Jaffe, [place] sent at 05/05/2021 11:39 AM EDT ----- Please call patient and let him know that his thyroid, kidney and liver function are WNL.  He does not show signs of anemia.  His labs completed did not show any reason for his weight loss.

## 2021-05-06 NOTE — Telephone Encounter (Signed)
Medical Assistant left message on patient's home and cell voicemail. Voicemail states to give a call back to [place] with [facility] at [phone] Patient viewed results via mychart.

## 2021-05-23 NOTE — Progress Notes (Signed)
Patient ID: Chad Simmons, male    DOB: 01-27-1996  MRN: [id number]  CC: Annual Physical Exam  Subjective: Chad Simmons is a 25 y.o. male who presents for annual physical exam.   His concerns today include: none. Seeing Dermatology for facial acne.    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     tiZANidine (ZANAFLEX) 4 MG tablet Take 2 mg by mouth 3 (three) times daily as needed.     albuterol (VENTOLIN HFA) 108 (90 Base) MCG/ACT inhaler Inhale 2 puffs into the lungs every 4 (four) hours as needed for wheezing or shortness of breath (cough, shortness of breath or wheezing.). 18 g 5   cetirizine (ZYRTEC) 10 MG tablet Take 10 mg by mouth daily.     desonide (DESOWEN) 0.05 % cream Apply topically 2 (two) times daily. Use sparingly to the affected area 30 g 0   EPINEPHrine 0.3 mg/0.3 mL IJ SOAJ injection Inject 0.3 mg into the muscle.     omeprazole (PRILOSEC) 20 MG capsule Take 1 capsule (20 mg total) by mouth daily. 30 capsule 3   No current facility-administered medications on file prior to visit.    Allergies  Allergen Reactions   Peanut-Containing Drug Products Anaphylaxis   Egg [Eggs Or Egg-Derived Products]     Social History   Socioeconomic History   Marital status: Single    Spouse name: Not on file   Number of children: Not on file   Years of education: Not on file   Highest education level: Not on file  Occupational History   Not on file  Tobacco Use   Smoking status: Never    Smokeless tobacco: Never  Vaping Use   Vaping Use: Never used  Substance and Sexual Activity   Alcohol use: No   Drug use: No   Sexual activity: Yes  Other Topics Concern   Not on file  Social History Narrative   9th grade 2014-2015   Social Determinants of Health   Financial Resource Strain: Not on file  Food Insecurity: Not on file  Transportation Needs: Not on file  Physical Activity: Not on file  Stress: Not on file  Social Connections: Not on file  Intimate Partner Violence: Not on file    Family History  Problem Relation Age of Onset   Irritable bowel syndrome Mother    Cholelithiasis Neg Hx    Ulcers Neg Hx     History reviewed. No pertinent surgical history.  ROS: Review of Systems Negative except as stated above  PHYSICAL EXAM: BP 123/77 (BP Location: Left Arm, Patient Position: Sitting, Cuff Size: Normal)   Pulse 79   Temp 98.1 F (36.7 C)   Resp 16   Ht 5' 7.99" (1.727 m)   Wt 171 lb 9.6 oz (77.8 kg)   SpO2 97%   BMI 26.10 kg/m   Physical Exam HENT:     Head: Normocephalic and atraumatic.     Right Ear: Tympanic membrane, ear canal and external  ear normal.     Left Ear: Tympanic membrane, ear canal and external ear normal.  Eyes:     Extraocular Movements: Extraocular movements intact.     Conjunctiva/sclera: Conjunctivae normal.     Pupils: Pupils are equal, round, and reactive to light.  Cardiovascular:     Rate and Rhythm: Normal rate and regular rhythm.     Pulses: Normal pulses.     Heart sounds: Normal heart sounds.  Pulmonary:     Effort: Pulmonary effort is normal.     Breath sounds: Normal breath sounds.  Abdominal:     General: Abdomen is flat. Bowel sounds are normal.     Palpations: Abdomen is soft.  Genitourinary:    Comments: Patient declined exam. Musculoskeletal:        General: Normal range of motion.     Cervical back: Normal range of motion and neck supple.  Skin:    General: Skin is warm and dry.     Capillary  Refill: Capillary refill takes less than 2 seconds.     Comments: Mild facial acne.  Neurological:     General: No focal deficit present.     Mental Status: He is alert and oriented to person, place, and time. Mental status is at baseline.  Psychiatric:        Mood and Affect: Mood normal.        Behavior: Behavior normal.   ASSESSMENT AND PLAN: 1. Annual physical exam: - Patient presents today to establish care.  - Return for annual physical examination, labs, and health maintenance. Arrive fasting meaning having no food for at least 8 hours prior to appointment. You may have only water or black coffee. Please take scheduled medications as normal.  2. Screening for metabolic disorder: - CMP last obtained 04/30/2021 and normal at that time.   3. Screening for deficiency anemia: - CBC last obtained 04/30/2021 and normal at that time.   4. Diabetes mellitus screening: 5. Prediabetes - Hemoglobin A1c today 6.0% and consistent with pre-diabetes, newly diagnosed.  - Discussed the importance of healthy eating habits, low-carbohydrate diet, low-sugar diet, regular aerobic exercise (at least 150 minutes a week as tolerated) to achieve or maintain control of pre-diabetes. - Follow-up with primary provider in 6 months or sooner if needed.  - POCT glycosylated hemoglobin (Hb A1C)  6. Screening cholesterol level: - Patient non-fasting. Patient will return for lab only visit when best convenience for him. - Lipid panel; Future  7. Thyroid disorder screen: - TSH last obtained 04/30/2021 and normal at that time.   8. Need for hepatitis C screening test: - Patient will return for lab only visit when best convenience for him. - Hepatitis C Antibody; Future  9. Need for Tdap vaccination: - Administered today in office.  - Tdap vaccine greater than or equal to 7yo IM  10. Need for HPV vaccine: - Administered today in office. - HPV 9-valent vaccine,Recombinat  Patient was given the opportunity  to ask questions.  Patient verbalized understanding of the plan and was able to repeat key elements of the plan. Patient was given clear instructions to go to Emergency Department or return to medical center if symptoms don't improve, worsen, or new problems develop.The patient verbalized understanding.   Orders Placed This Encounter  Procedures   Tdap vaccine greater than or equal to 7yo IM   HPV 9-valent vaccine,Recombinat   Lipid panel   Hepatitis C Antibody   POCT glycosylated hemoglobin (Hb A1C)  Return in about 6 months (around 11/25/2021) for Follow-Up prediabetes.  Rema Fendt, NP

## 2021-05-25 ENCOUNTER — Other Ambulatory Visit: Payer: Self-pay

## 2021-05-25 ENCOUNTER — Ambulatory Visit (INDEPENDENT_AMBULATORY_CARE_PROVIDER_SITE_OTHER): Payer: 59 | Admitting: Family

## 2021-05-25 ENCOUNTER — Encounter: Payer: Self-pay | Admitting: Family

## 2021-05-25 VITALS — BP 123/77 | HR 79 | Temp 98.1°F | Resp 16 | Ht 67.99 in | Wt 171.6 lb

## 2021-05-25 DIAGNOSIS — Z0000 Encounter for general adult medical examination without abnormal findings: Secondary | ICD-10-CM | POA: Diagnosis not present

## 2021-05-25 DIAGNOSIS — Z23 Encounter for immunization: Secondary | ICD-10-CM

## 2021-05-25 DIAGNOSIS — Z13228 Encounter for screening for other metabolic disorders: Secondary | ICD-10-CM

## 2021-05-25 DIAGNOSIS — Z131 Encounter for screening for diabetes mellitus: Secondary | ICD-10-CM

## 2021-05-25 DIAGNOSIS — Z1329 Encounter for screening for other suspected endocrine disorder: Secondary | ICD-10-CM

## 2021-05-25 DIAGNOSIS — R7303 Prediabetes: Secondary | ICD-10-CM | POA: Diagnosis not present

## 2021-05-25 DIAGNOSIS — Z13220 Encounter for screening for lipoid disorders: Secondary | ICD-10-CM

## 2021-05-25 DIAGNOSIS — Z1159 Encounter for screening for other viral diseases: Secondary | ICD-10-CM

## 2021-05-25 DIAGNOSIS — Z130 Encounter for screening for diseases of the blood and blood-forming organs and certain disorders involving the immune mechanism: Secondary | ICD-10-CM

## 2021-05-25 LAB — POCT GLYCOSYLATED HEMOGLOBIN (HGB A1C): Hemoglobin A1C: 6 % — AB (ref 4.0–5.6)

## 2021-05-25 NOTE — Progress Notes (Signed)
Pt presents for annual exam no other concerns

## 2021-05-25 NOTE — Addendum Note (Signed)
Addended by: Margorie John on: 05/25/2021 04:47 PM   Modules accepted: Orders

## 2021-05-26 ENCOUNTER — Other Ambulatory Visit: Payer: 59

## 2021-05-26 ENCOUNTER — Other Ambulatory Visit: Payer: Self-pay

## 2021-05-26 DIAGNOSIS — Z1159 Encounter for screening for other viral diseases: Secondary | ICD-10-CM

## 2021-05-26 DIAGNOSIS — Z13220 Encounter for screening for lipoid disorders: Secondary | ICD-10-CM

## 2021-05-26 NOTE — Progress Notes (Signed)
Labs completed

## 2021-05-27 LAB — LIPID PANEL
Chol/HDL Ratio: 2.6 ratio (ref 0.0–5.0)
Cholesterol, Total: 148 mg/dL (ref 100–199)
HDL: 57 mg/dL (ref >39)
LDL Chol Calc (NIH): 82 mg/dL (ref 0–99)
Triglycerides: 37 mg/dL (ref 0–149)
VLDL Cholesterol Cal: 9 mg/dL (ref 5–40)

## 2021-05-27 LAB — HEPATITIS C ANTIBODY: Hep C Virus Ab: 0.2 s/co ratio (ref 0.0–0.9)

## 2021-05-27 NOTE — Progress Notes (Signed)
Cholesterol normal.   Hepatitis C negative.

## 2021-10-05 ENCOUNTER — Other Ambulatory Visit: Payer: Self-pay | Admitting: Nurse Practitioner

## 2021-10-05 ENCOUNTER — Other Ambulatory Visit: Payer: Self-pay

## 2021-10-05 ENCOUNTER — Ambulatory Visit
Admission: RE | Admit: 2021-10-05 | Discharge: 2021-10-05 | Disposition: A | Discharge status: Home / Self Care | Payer: No Typology Code available for payment source | Source: Ambulatory Visit | Attending: Nurse Practitioner | Admitting: Nurse Practitioner

## 2021-10-05 DIAGNOSIS — Z021 Encounter for pre-employment examination: Secondary | ICD-10-CM

## 2021-11-23 NOTE — Progress Notes (Signed)
Patient ID: Chad Simmons, male    DOB: 12/08/1995  MRN: [id number]  CC: Prediabetes Follow-Up   Subjective: Chad Simmons is a 26 y.o. male who presents for prediabetes follow-up.   His concerns today include:  No issues/concerns.    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     albuterol (VENTOLIN HFA) 108 (90 Base) MCG/ACT inhaler Inhale 2 puffs into the lungs every 4 (four) hours as needed for wheezing or shortness of breath (cough, shortness of breath or wheezing.). 18 g 5   cetirizine (ZYRTEC) 10 MG tablet Take 10 mg by mouth daily.     desonide (DESOWEN) 0.05 % cream Apply topically 2 (two) times daily. Use sparingly to the affected area 30 g 0   diclofenac (VOLTAREN) 75 MG EC tablet Take 75 mg by mouth 2 (two) times daily as needed.     EPINEPHrine 0.3 mg/0.3 mL IJ SOAJ injection Inject 0.3 mg into the muscle.     No current facility-administered medications on file prior to visit.    Allergies  Allergen Reactions   Peanut-Containing Drug Products Anaphylaxis   Egg [Eggs Or Egg-Derived Products]     Social History   Socioeconomic History   Marital status: Single    Spouse name: Not on file   Number of children: Not on file   Years of education: Not on file   Highest education level: Not on file  Occupational History   Not on file  Tobacco Use   Smoking status: Never   Smokeless tobacco: Never  Vaping Use   Vaping Use: Never used  Substance and Sexual Activity   Alcohol use:  No   Drug use: No   Sexual activity: Yes  Other Topics Concern   Not on file  Social History Narrative   9th grade 2014-2015   Social Determinants of Health   Financial Resource Strain: Not on file  Food Insecurity: Not on file  Transportation Needs: Not on file  Physical Activity: Not on file  Stress: Not on file  Social Connections: Not on file  Intimate Partner Violence: Not on file    Family History  Problem Relation Age of Onset   Irritable bowel syndrome Mother    Cholelithiasis Neg Hx    Ulcers Neg Hx     No past surgical history on file.  ROS: Review of Systems Negative except as stated above  PHYSICAL EXAM: BP 134/71 (BP Location: Left Arm, Patient Position: Sitting, Cuff Size: Large)    Pulse 74    Temp 98.3 F (36.8 C)    Resp 18    Ht 5' 7.99" (1.727 m)    Wt 185 lb 12.8 oz (84.3 kg)    SpO2 96%    BMI 28.26 kg/m   Physical Exam HENT:     Head: Normocephalic and atraumatic.  Eyes:     Extraocular Movements: Extraocular movements intact.     Conjunctiva/sclera: Conjunctivae normal.     Pupils: Pupils are equal, round, and  reactive to light.  Cardiovascular:     Rate and Rhythm: Normal rate and regular rhythm.     Pulses: Normal pulses.     Heart sounds: Normal heart sounds.  Pulmonary:     Effort: Pulmonary effort is normal.     Breath sounds: Normal breath sounds.  Musculoskeletal:     Cervical back: Normal range of motion and neck supple.  Neurological:     General: No focal deficit present.     Mental Status: He is alert and oriented to person, place, and time.  Psychiatric:        Mood and Affect: Mood normal.        Behavior: Behavior normal.   Results for orders placed or performed in visit on 11/26/21  POCT glycosylated hemoglobin (Hb A1C)  Result Value Ref Range   Hemoglobin A1C 5.9 (A) 4.0 - 5.6 %   HbA1c POC (<> result, manual entry)     HbA1c, POC (prediabetic range)     HbA1c, POC (controlled diabetic range)      ASSESSMENT  AND PLAN: 1. Prediabetes: - Hemoglobin A1c today 5.9% and remaining consistent with prediabetes. No medication needed as of present.  - Discussed the importance of healthy eating habits, low-carbohydrate diet, low-sugar diet, and regular aerobic exercise (at least 150 minutes a week as tolerated) to achieve or maintain control of prediabetes. - Follow-up with primary provider in 6 months or sooner if needed.  - POCT glycosylated hemoglobin (Hb A1C)    Patient was given the opportunity to ask questions.  Patient verbalized understanding of the plan and was able to repeat key elements of the plan. Patient was given clear instructions to go to Emergency Department or return to medical center if symptoms don't improve, worsen, or new problems develop.The patient verbalized understanding.   Orders Placed This Encounter  Procedures   POCT glycosylated hemoglobin (Hb A1C)    Return in about 6 months (around 05/26/2022) for Follow-Up or next available prediabetes .  Camillia Herter, NP

## 2021-11-25 ENCOUNTER — Ambulatory Visit: Payer: 59 | Admitting: Family

## 2021-11-26 ENCOUNTER — Other Ambulatory Visit: Payer: Self-pay

## 2021-11-26 ENCOUNTER — Ambulatory Visit (INDEPENDENT_AMBULATORY_CARE_PROVIDER_SITE_OTHER): Payer: Self-pay | Admitting: Family

## 2021-11-26 VITALS — BP 134/71 | HR 74 | Temp 98.3°F | Resp 18 | Ht 67.99 in | Wt 185.8 lb

## 2021-11-26 DIAGNOSIS — R7303 Prediabetes: Secondary | ICD-10-CM

## 2021-11-26 LAB — POCT GLYCOSYLATED HEMOGLOBIN (HGB A1C): Hemoglobin A1C: 5.9 % — AB (ref 4.0–5.6)

## 2021-11-26 NOTE — Progress Notes (Signed)
Prediabetes discussed in office.

## 2021-11-26 NOTE — Progress Notes (Signed)
Pt presents for prediabetes follow-up  

## 2022-04-07 ENCOUNTER — Other Ambulatory Visit: Payer: Self-pay

## 2022-04-07 ENCOUNTER — Emergency Department (EMERGENCY_DEPARTMENT_HOSPITAL): Payer: 59 | Admitting: Certified Registered"

## 2022-04-07 ENCOUNTER — Emergency Department (HOSPITAL_COMMUNITY): Payer: 59 | Admitting: Certified Registered"

## 2022-04-07 ENCOUNTER — Emergency Department (HOSPITAL_BASED_OUTPATIENT_CLINIC_OR_DEPARTMENT_OTHER): Payer: 59

## 2022-04-07 ENCOUNTER — Encounter (HOSPITAL_COMMUNITY)
Admission: EM | Disposition: A | Discharge status: Home / Self Care | Payer: Self-pay | Source: Home / Self Care | Attending: Emergency Medicine

## 2022-04-07 ENCOUNTER — Ambulatory Visit: Admit: 2022-04-07 | Payer: 59 | Admitting: Urology

## 2022-04-07 ENCOUNTER — Encounter (HOSPITAL_COMMUNITY): Payer: Self-pay | Admitting: Urology

## 2022-04-07 ENCOUNTER — Ambulatory Visit (HOSPITAL_BASED_OUTPATIENT_CLINIC_OR_DEPARTMENT_OTHER)
Admission: EM | Admit: 2022-04-07 | Discharge: 2022-04-07 | Disposition: A | Discharge status: Home / Self Care | Payer: 59 | Attending: Emergency Medicine | Admitting: Emergency Medicine

## 2022-04-07 DIAGNOSIS — J45909 Unspecified asthma, uncomplicated: Secondary | ICD-10-CM | POA: Insufficient documentation

## 2022-04-07 DIAGNOSIS — N4400 Torsion of testis, unspecified: Secondary | ICD-10-CM | POA: Insufficient documentation

## 2022-04-07 DIAGNOSIS — R1030 Lower abdominal pain, unspecified: Secondary | ICD-10-CM | POA: Diagnosis present

## 2022-04-07 DIAGNOSIS — Z91010 Allergy to peanuts: Secondary | ICD-10-CM | POA: Insufficient documentation

## 2022-04-07 HISTORY — PX: ORCHIOPEXY: SHX479

## 2022-04-07 LAB — URINALYSIS, ROUTINE W REFLEX MICROSCOPIC
Bilirubin Urine: NEGATIVE
Glucose, UA: NEGATIVE mg/dL
Hgb urine dipstick: NEGATIVE
Leukocytes,Ua: NEGATIVE
Nitrite: NEGATIVE
Specific Gravity, Urine: 1.023 (ref 1.005–1.030)
pH: 8.5 — ABNORMAL HIGH (ref 5.0–8.0)

## 2022-04-07 LAB — BASIC METABOLIC PANEL
Anion gap: 11 (ref 5–15)
BUN: 23 mg/dL — ABNORMAL HIGH (ref 6–20)
CO2: 24 mmol/L (ref 22–32)
Calcium: 9.8 mg/dL (ref 8.9–10.3)
Chloride: 103 mmol/L (ref 98–111)
Creatinine, Ser: 1.17 mg/dL (ref 0.61–1.24)
GFR, Estimated: >60 mL/min (ref >60)
Glucose, Bld: 147 mg/dL — ABNORMAL HIGH (ref 70–99)
Potassium: 4 mmol/L (ref 3.5–5.1)
Sodium: 138 mmol/L (ref 135–145)

## 2022-04-07 LAB — CBC WITH DIFFERENTIAL/PLATELET
Abs Immature Granulocytes: 0.03 10*3/uL (ref 0.00–0.07)
Basophils Absolute: 0.1 10*3/uL (ref 0.0–0.1)
Basophils Relative: 1 %
Eosinophils Absolute: 0.2 10*3/uL (ref 0.0–0.5)
Eosinophils Relative: 2 %
HCT: 42 % (ref 39.0–52.0)
Hemoglobin: 13.7 g/dL (ref 13.0–17.0)
Immature Granulocytes: 0 %
Lymphocytes Relative: 22 %
Lymphs Abs: 2 10*3/uL (ref 0.7–4.0)
MCH: 26.9 pg (ref 26.0–34.0)
MCHC: 32.6 g/dL (ref 30.0–36.0)
MCV: 82.5 fL (ref 80.0–100.0)
Monocytes Absolute: 0.5 10*3/uL (ref 0.1–1.0)
Monocytes Relative: 5 %
Neutro Abs: 6.5 10*3/uL (ref 1.7–7.7)
Neutrophils Relative %: 70 %
Platelets: 205 10*3/uL (ref 150–400)
RBC: 5.09 MIL/uL (ref 4.22–5.81)
RDW: 13.8 % (ref 11.5–15.5)
WBC: 9.2 10*3/uL (ref 4.0–10.5)
nRBC: 0 % (ref 0.0–0.2)

## 2022-04-07 SURGERY — ORCHIOPEXY ADULT
Anesthesia: General | Laterality: Bilateral

## 2022-04-07 MED ORDER — SUGAMMADEX SODIUM 200 MG/2ML IV SOLN
INTRAVENOUS | Status: DC | PRN
  Administered 2022-04-07: 200 mg via INTRAVENOUS

## 2022-04-07 MED ORDER — ORAL CARE MOUTH RINSE: 15.0000 mL | Freq: Once | OROMUCOSAL | Status: AC

## 2022-04-07 MED ORDER — FENTANYL CITRATE (PF) 100 MCG/2ML IJ SOLN
INTRAMUSCULAR | Status: AC
  Filled 2022-04-07: qty 2

## 2022-04-07 MED ORDER — DEXAMETHASONE SODIUM PHOSPHATE 10 MG/ML IJ SOLN
INTRAMUSCULAR | Status: AC
  Filled 2022-04-07: qty 1

## 2022-04-07 MED ORDER — BUPIVACAINE HCL (PF) 0.25 % IJ SOLN
INTRAMUSCULAR | Status: AC
  Filled 2022-04-07: qty 30

## 2022-04-07 MED ORDER — 0.9 % SODIUM CHLORIDE (POUR BTL) OPTIME
TOPICAL | Status: DC | PRN
  Administered 2022-04-07: 1000 mL

## 2022-04-07 MED ORDER — LIDOCAINE 2% (20 MG/ML) 5 ML SYRINGE
INTRAMUSCULAR | Status: DC | PRN
  Administered 2022-04-07: 60 mg via INTRAVENOUS

## 2022-04-07 MED ORDER — ONDANSETRON HCL 4 MG/2ML IJ SOLN
INTRAMUSCULAR | Status: DC | PRN
  Administered 2022-04-07: 4 mg via INTRAVENOUS

## 2022-04-07 MED ORDER — PROPOFOL 10 MG/ML IV BOLUS
INTRAVENOUS | Status: AC
  Filled 2022-04-07: qty 20

## 2022-04-07 MED ORDER — PROPOFOL 10 MG/ML IV BOLUS
INTRAVENOUS | Status: DC | PRN
  Administered 2022-04-07: 200 mg via INTRAVENOUS

## 2022-04-07 MED ORDER — DEXAMETHASONE SODIUM PHOSPHATE 10 MG/ML IJ SOLN
INTRAMUSCULAR | Status: DC | PRN
  Administered 2022-04-07: 8 mg via INTRAVENOUS

## 2022-04-07 MED ORDER — ONDANSETRON HCL 4 MG/2ML IJ SOLN
4.0000 mg | Freq: Once | INTRAMUSCULAR | Status: AC
  Administered 2022-04-07: 4 mg via INTRAVENOUS
  Filled 2022-04-07: qty 2

## 2022-04-07 MED ORDER — HYDROMORPHONE HCL 1 MG/ML IJ SOLN
1.0000 mg | Freq: Once | INTRAMUSCULAR | Status: AC
  Administered 2022-04-07: 1 mg via INTRAVENOUS
  Filled 2022-04-07: qty 1

## 2022-04-07 MED ORDER — ROCURONIUM BROMIDE 10 MG/ML (PF) SYRINGE
PREFILLED_SYRINGE | INTRAVENOUS | Status: DC | PRN
  Administered 2022-04-07: 10 mg via INTRAVENOUS
  Administered 2022-04-07: 30 mg via INTRAVENOUS
  Administered 2022-04-07: 10 mg via INTRAVENOUS

## 2022-04-07 MED ORDER — MIDAZOLAM HCL 2 MG/2ML IJ SOLN
INTRAMUSCULAR | Status: AC
  Filled 2022-04-07: qty 2

## 2022-04-07 MED ORDER — FENTANYL CITRATE (PF) 250 MCG/5ML IJ SOLN
INTRAMUSCULAR | Status: DC | PRN
  Administered 2022-04-07: 100 ug via INTRAVENOUS

## 2022-04-07 MED ORDER — LACTATED RINGERS IV SOLN: INTRAVENOUS | Status: DC

## 2022-04-07 MED ORDER — SUCCINYLCHOLINE CHLORIDE 200 MG/10ML IV SOSY
PREFILLED_SYRINGE | INTRAVENOUS | Status: DC | PRN
  Administered 2022-04-07: 100 mg via INTRAVENOUS

## 2022-04-07 MED ORDER — BUPIVACAINE HCL (PF) 0.25 % IJ SOLN
INTRAMUSCULAR | Status: DC | PRN
  Administered 2022-04-07: 8 mL

## 2022-04-07 MED ORDER — CEFAZOLIN SODIUM-DEXTROSE 2-4 GM/100ML-% IV SOLN
2.0000 g | Freq: Once | INTRAVENOUS | Status: AC
Start: 2022-04-07 — End: 2022-04-07
  Administered 2022-04-07: 2 g via INTRAVENOUS
  Filled 2022-04-07: qty 100

## 2022-04-07 MED ORDER — ONDANSETRON HCL 4 MG/2ML IJ SOLN
INTRAMUSCULAR | Status: AC
  Filled 2022-04-07: qty 2

## 2022-04-07 MED ORDER — CHLORHEXIDINE GLUCONATE 0.12 % MT SOLN
15.0000 mL | Freq: Once | OROMUCOSAL | Status: AC
Start: 2022-04-07 — End: 2022-04-07
  Administered 2022-04-07: 15 mL via OROMUCOSAL

## 2022-04-07 MED ORDER — FENTANYL CITRATE PF 50 MCG/ML IJ SOSY: 25.0000 ug | PREFILLED_SYRINGE | INTRAMUSCULAR | Status: DC | PRN

## 2022-04-07 MED ORDER — MIDAZOLAM HCL 2 MG/2ML IJ SOLN
INTRAMUSCULAR | Status: DC | PRN
  Administered 2022-04-07: 2 mg via INTRAVENOUS

## 2022-04-07 MED ORDER — ACETAMINOPHEN 10 MG/ML IV SOLN: 1000.0000 mg | Freq: Once | INTRAVENOUS | Status: DC | PRN

## 2022-04-07 SURGICAL SUPPLY — 29 items
BAG COUNTER SPONGE SURGICOUNT (BAG) IMPLANT
BNDG GAUZE DERMACEA FLUFF (GAUZE/BANDAGES/DRESSINGS) ×1
BNDG GAUZE DERMACEA FLUFF 4 (GAUZE/BANDAGES/DRESSINGS) IMPLANT
BNDG GAUZE ELAST 4 BULKY (GAUZE/BANDAGES/DRESSINGS) ×2 IMPLANT
COVER SURGICAL LIGHT HANDLE (MISCELLANEOUS) ×2 IMPLANT
DERMABOND ADVANCED (GAUZE/BANDAGES/DRESSINGS) ×1
DERMABOND ADVANCED .7 DNX12 (GAUZE/BANDAGES/DRESSINGS) IMPLANT
ELECT REM PT RETURN 15FT ADLT (MISCELLANEOUS) ×2 IMPLANT
GLOVE SURG LX 7.5 STRW (GLOVE) ×1
GLOVE SURG LX STRL 7.5 STRW (GLOVE) ×1 IMPLANT
GOWN STRL REUS W/ TWL XL LVL3 (GOWN DISPOSABLE) ×1 IMPLANT
GOWN STRL REUS W/TWL XL LVL3 (GOWN DISPOSABLE) ×1
KIT BASIN OR (CUSTOM PROCEDURE TRAY) ×2 IMPLANT
KIT TURNOVER KIT A (KITS) IMPLANT
NEEDLE HYPO 22GX1.5 SAFETY (NEEDLE) IMPLANT
NS IRRIG 1000ML POUR BTL (IV SOLUTION) ×2 IMPLANT
PACK GENERAL/GYN (CUSTOM PROCEDURE TRAY) ×2 IMPLANT
SUPPORT SCROTAL LG STRP (MISCELLANEOUS) ×2 IMPLANT
SUT CHROMIC 3 0 SH 27 (SUTURE) ×4 IMPLANT
SUT CHROMIC 4 0 RB 1X27 (SUTURE) IMPLANT
SUT PROLENE 3 0 SH 48 (SUTURE) ×1 IMPLANT
SUT VIC AB 2-0 SH 27 (SUTURE) ×6
SUT VIC AB 2-0 SH 27X BRD (SUTURE) IMPLANT
SUT VIC AB 2-0 SH 27XBRD (SUTURE) IMPLANT
SUT VIC AB 2-0 UR5 27 (SUTURE) IMPLANT
SUT VICRYL 0 TIES 12 18 (SUTURE) IMPLANT
SYR CONTROL 10ML LL (SYRINGE) IMPLANT
TOWEL OR 17X26 10 PK STRL BLUE (TOWEL DISPOSABLE) ×4 IMPLANT
WATER STERILE IRR 1000ML POUR (IV SOLUTION) ×2 IMPLANT

## 2022-04-07 NOTE — Op Note (Addendum)
Preoperative diagnosis:   1. Right testicular torsion  Postoperative diagnosis: 1. Right testicular torsion  Procedure(s): 1. Scrotal exploration 2. Bilateral orchiopexy   Surgeon: Mena Goes, MD  Assistant: Judson Roch, MD PGY4  Anesthesia: General  Complications: None  EBL: Minimal  Specimens: None  Intraoperative findings: Right testicle appeared to have detorsed. Right sided bellclapper deformity. Right testicle appeared healthy and was normal in size. Mildly atrophic left testicle. No palpable mass of either testicle. Bilateral orchiopexy performed.   Indication:  26 y.o. male who presented with right testicular pain.  Exam findings and ultrasound findings were consistent with testicular torsion.  After discussing these findings, I recommended the above procedures.  I discussed the potential benefits and risks of the procedure, side effects of the proposed treatment, the likelihood of the patient achieving the goals of the procedure, and any potential problems that might occur during the procedure or recuperation. We specifically discussed the potential loss of his testicle and the possibility of further infertility or testosterone deficiency. He gave informed consent to proceed.  Description of procedure:  The patient was taken to the operating room, placed supine, given general anesthesia, and prepped and draped in the usual sterile fashion.  He was given preoperative antibiotics and a preoperative time out was performed.  A midline scrotal incision was made and carried down through the dartos layer over the right testis.  The testis was delivered and was noted to be healthy appearing with normal color and size.  There was no twisting of the cord but a bell clapper deformity was present. The testicle was then pexied in a three point fixation to the medial tunica vaginalis with 3-0 prolene sutures. The testis was easily placed in the hemiscrotum and the tunica was closed with a 2-0  vicryl.   Attention then turned to the contralateral normal testis which was delivered in a similar fashion.  This appeared mildly atrophic. There is no bellclapper deformity. A 2 point fixation technique was performed as described above. After the testis was placed back in the left hemiscrotum again and the tunica closed with a running 2-0 vicryl. Hemostasis was then ensured.     The dartos layer was closed with 2-0 vicryl.The scrotal skin was then closed with a running horizontal mattress suture with 3-0 chromic and dermabond. A scrotal support was applied with a fluff dressing.  The patient tolerated the procedure well and without complications.  He was able to be extubated and transferred to the PACU in stable condition.  I was present and scrubbed the entire procedure.

## 2022-04-07 NOTE — Anesthesia Procedure Notes (Signed)
Procedure Name: Intubation Date/Time: 04/07/2022 9:04 AM Performed by: Eben Burow, CRNA Pre-anesthesia Checklist: Patient identified, Emergency Drugs available, Suction available, Patient being monitored and Timeout performed Patient Re-evaluated:Patient Re-evaluated prior to induction Oxygen Delivery Method: Circle system utilized Preoxygenation: Pre-oxygenation with 100% oxygen Induction Type: IV induction and Rapid sequence Laryngoscope Size: Mac and 4 Grade View: Grade I Tube type: Oral Tube size: 7.5 mm Number of attempts: 1 Airway Equipment and Method: Stylet Placement Confirmation: ETT inserted through vocal cords under direct vision, positive ETCO2 and breath sounds checked- equal and bilateral Secured at: 23 cm Tube secured with: Tape Dental Injury: Teeth and Oropharynx as per pre-operative assessment

## 2022-04-07 NOTE — H&P (Signed)
H&P  Chief Complaint: Right testicle pain/torsion  History of Present Illness: Chad Simmons is a 26 year old male.  He is in the [employer] right now.  He developed the acute onset of right testicular pain this morning.  Scrotal ultrasound reveals no flow to the right testicle, normal flow to the left testicle.  Right testicle mildly tender and higher riding on exam.  He denies any other urologic history.  He is here with his sister and his mom.  Past Medical History:  Diagnosis Date   Abdominal pain    Asthma    Constipation    Decreased appetite    Eczema    Nausea    Weight loss    No past surgical history on file.  Home Medications:  (Not in a hospital admission)  Allergies:  Allergies  Allergen Reactions   Peanut-Containing Drug Products Anaphylaxis   Egg [Eggs Or Egg-Derived Products]     Family History  Problem Relation Age of Onset   Irritable bowel syndrome Mother    Cholelithiasis Neg Hx    Ulcers Neg Hx    Social History:  reports that he has never smoked. He has never used smokeless tobacco. He reports that he does not drink alcohol and does not use drugs.  ROS: A complete review of systems was performed.  All systems are negative except for pertinent findings as noted. Review of Systems  All other systems reviewed and are negative.   Physical Exam:  Vital signs in last 24 hours: Temp:  [97.9 F (36.6 C)-98.6 F (37 C)] 98.6 F (37 C) (06/07 0700) Pulse Rate:  [50-62] 62 (06/07 0738) Resp:  [18-20] 18 (06/07 0738) BP: (132-146)/(63-81) 145/71 (06/07 0738) SpO2:  [100 %] 100 % (06/07 0738) Weight:  [83.9 kg] 83.9 kg (06/07 0547) General:  Alert and oriented, No acute distress HEENT: Normocephalic, atraumatic Cardiovascular: Regular rate and rhythm Lungs: Regular rate and effort Abdomen: Soft, nontender, nondistended, no abdominal masses Back: No CVA tenderness Extremities: No edema Neurologic: Grossly intact GU: Penis circumcised, normal foreskin,  left testicle descended and palpably normal, epididymis palpably normal, scrotum normal.  Right testicle mildly tender but not severe.  Slightly higher riding than the left.  Otherwise palpably normal.  No mass.    Laboratory Data:  Results for orders placed or performed during the hospital encounter of 04/07/22 (from the past 24 hour(s))  CBC with Differential/Platelet     Status: None   Collection Time: 04/07/22  5:55 AM  Result Value Ref Range   WBC 9.2 4.0 - 10.5 K/uL   RBC 5.09 4.22 - 5.81 MIL/uL   Hemoglobin 13.7 13.0 - 17.0 g/dL   HCT 78.2 95.6 - 21.3 %   MCV 82.5 80.0 - 100.0 fL   MCH 26.9 26.0 - 34.0 pg   MCHC 32.6 30.0 - 36.0 g/dL   RDW 08.6 57.8 - 46.9 %   Platelets 205 150 - 400 K/uL   nRBC 0.0 0.0 - 0.2 %   Neutrophils Relative % 70 %   Neutro Abs 6.5 1.7 - 7.7 K/uL   Lymphocytes Relative 22 %   Lymphs Abs 2.0 0.7 - 4.0 K/uL   Monocytes Relative 5 %   Monocytes Absolute 0.5 0.1 - 1.0 K/uL   Eosinophils Relative 2 %   Eosinophils Absolute 0.2 0.0 - 0.5 K/uL   Basophils Relative 1 %   Basophils Absolute 0.1 0.0 - 0.1 K/uL   Immature Granulocytes 0 %   Abs Immature Granulocytes 0.03 0.00 -  0.07 K/uL  Basic metabolic panel     Status: Abnormal   Collection Time: 04/07/22  5:55 AM  Result Value Ref Range   Sodium 138 135 - 145 mmol/L   Potassium 4.0 3.5 - 5.1 mmol/L   Chloride 103 98 - 111 mmol/L   CO2 24 22 - 32 mmol/L   Glucose, Bld 147 (H) 70 - 99 mg/dL   BUN 23 (H) 6 - 20 mg/dL   Creatinine, Ser 9.56 0.61 - 1.24 mg/dL   Calcium 9.8 8.9 - 38.7 mg/dL   GFR, Estimated >56 >43 mL/min   Anion gap 11 5 - 15  Urinalysis, Routine w reflex microscopic Urine, Clean Catch     Status: Abnormal   Collection Time: 04/07/22  7:10 AM  Result Value Ref Range   Color, Urine YELLOW YELLOW   APPearance HAZY (A) CLEAR   Specific Gravity, Urine 1.023 1.005 - 1.030   pH 8.5 (H) 5.0 - 8.0   Glucose, UA NEGATIVE NEGATIVE mg/dL   Hgb urine dipstick NEGATIVE NEGATIVE   Bilirubin  Urine NEGATIVE NEGATIVE   Ketones, ur TRACE (A) NEGATIVE mg/dL   Protein, ur TRACE (A) NEGATIVE mg/dL   Nitrite NEGATIVE NEGATIVE   Leukocytes,Ua NEGATIVE NEGATIVE   RBC / HPF 0-5 0 - 5 RBC/hpf   WBC, UA 0-5 0 - 5 WBC/hpf   Bacteria, UA RARE (A) NONE SEEN   Squamous Epithelial / LPF 0-5 0 - 5   Mucus PRESENT    Amorphous Crystal PRESENT    No results found for this or any previous visit (from the past 240 hour(s)). Creatinine: Recent Labs    04/07/22 0555  CREATININE 1.17    Impression/Assessment:  Right testicular torsion-  Plan:  I discussed with the patient, his older sister and mom the nature, potential benefits, risks and alternatives to scrotal exploration, bilateral orchiopexy, possible right orchiectomy including side effects of the proposed treatment, the likelihood of the patient achieving the goals of the procedure, and any potential problems that might occur during the procedure or recuperation.  We discussed long-term effect on fertility and sexual function if the right testicle is removed.  We talked about sperm and testosterone production by the testicles.  We discussed the right testicle again may be necrotic and need to be removed or possibly has detorsed.  We discussed nonoperative management with continued observation.  All questions answered. Patient elects to proceed to the operating room for above.   Jerilee Field 04/07/2022, 8:27 AM

## 2022-04-07 NOTE — Discharge Instructions (Signed)
Go directly to [facility] Emergency Department.  Do not eat or drink anything.  You will need surgery emergently this morning.

## 2022-04-07 NOTE — Anesthesia Preprocedure Evaluation (Signed)
Anesthesia Evaluation  Patient identified by MRN, date of birth, ID band Patient awake    Reviewed: Allergy & Precautions, NPO status , Patient's Chart, lab work & pertinent test results  Airway Mallampati: I  TM Distance: >3 FB Neck ROM: Full    Dental  (+) Teeth Intact, Dental Advisory Given   Pulmonary asthma ,    breath sounds clear to auscultation       Cardiovascular negative cardio ROS   Rhythm:Regular Rate:Normal     Neuro/Psych negative neurological ROS  negative psych ROS   GI/Hepatic negative GI ROS, Neg liver ROS,   Endo/Other  negative endocrine ROS  Renal/GU negative Renal ROS     Musculoskeletal negative musculoskeletal ROS (+)   Abdominal Normal abdominal exam  (+)   Peds  Hematology negative hematology ROS (+)   Anesthesia Other Findings   Reproductive/Obstetrics                             Anesthesia Physical Anesthesia Plan  ASA: 1 and emergent  Anesthesia Plan: General   Post-op Pain Management:    Induction: Intravenous, Rapid sequence and Cricoid pressure planned  PONV Risk Score and Plan: 3 and Ondansetron, Dexamethasone and Midazolam  Airway Management Planned: Oral ETT  Additional Equipment: None  Intra-op Plan:   Post-operative Plan: Extubation in OR  Informed Consent: I have reviewed the patients History and Physical, chart, labs and discussed the procedure including the risks, benefits and alternatives for the proposed anesthesia with the patient or authorized representative who has indicated his/her understanding and acceptance.     Dental advisory given  Plan Discussed with: CRNA  Anesthesia Plan Comments:         Anesthesia Quick Evaluation

## 2022-04-07 NOTE — ED Triage Notes (Signed)
Right groin and testicle with scrotal swelling.  Symptoms started around 3 am.

## 2022-04-07 NOTE — ED Notes (Signed)
US at bedside

## 2022-04-07 NOTE — ED Provider Notes (Signed)
[facility] EMERGENCY DEPT Provider Note   CSN: [id number] Arrival date & time: 04/07/22  0536     History  Chief Complaint  Patient presents with   Groin Pain    Chad Simmons is a 26 y.o. male.  Patient presents to the emergency department with complaints of pain, tenderness, swelling of his right testicle.  He reports that symptoms started at 3 AM.  He denies any trauma.  He has not had any urethral discharge, penile or scrotal lesions.      Home Medications Prior to Admission medications   Medication Sig Start Date End Date Taking? Authorizing Provider  albuterol (VENTOLIN HFA) 108 (90 Base) MCG/ACT inhaler Inhale 2 puffs into the lungs every 4 (four) hours as needed for wheezing or shortness of breath (cough, shortness of breath or wheezing.). 01/25/20   Arvilla Market, MD  cetirizine (ZYRTEC) 10 MG tablet Take 10 mg by mouth daily.    [provider]  desonide (DESOWEN) 0.05 % cream Apply topically 2 (two) times daily. Use sparingly to the affected area 07/28/20   Cathie Hoops, Amy V, PA-C  diclofenac (VOLTAREN) 75 MG EC tablet Take 75 mg by mouth 2 (two) times daily as needed. 03/19/21   [provider]  EPINEPHrine 0.3 mg/0.3 mL IJ SOAJ injection Inject 0.3 mg into the muscle. 12/03/15   [provider]  tiZANidine (ZANAFLEX) 4 MG tablet Take 2 mg by mouth 3 (three) times daily as needed. 03/19/21   [provider]      Allergies    Peanut-containing drug products and Egg [eggs or egg-derived products]    Review of Systems   Review of Systems  Physical Exam Updated Vital Signs BP (!) 146/63   Pulse (!) 50   Temp 98.6 F (37 C) (Oral)   Resp 18   Ht 5\' 8"  (1.727 m)   Wt 83.9 kg   SpO2 100%   BMI 28.13 kg/m  Physical Exam Vitals and nursing note reviewed.  Constitutional:      General: He is not in acute distress.    Appearance: He is well-developed.  HENT:     Head: Normocephalic and atraumatic.      Mouth/Throat:     Mouth: Mucous membranes are moist.  Eyes:     General: Vision grossly intact. Gaze aligned appropriately.     Extraocular Movements: Extraocular movements intact.     Conjunctiva/sclera: Conjunctivae normal.  Cardiovascular:     Rate and Rhythm: Normal rate and regular rhythm.     Pulses: Normal pulses.     Heart sounds: Normal heart sounds, S1 normal and S2 normal. No murmur heard.   No friction rub. No gallop.  Pulmonary:     Effort: Pulmonary effort is normal. No respiratory distress.     Breath sounds: Normal breath sounds.  Abdominal:     Palpations: Abdomen is soft.     Tenderness: There is no abdominal tenderness. There is no guarding or rebound.     Hernia: No hernia is present.  Genitourinary:    Testes:        Right: Tenderness and swelling present.  Musculoskeletal:        General: No swelling.     Cervical back: Full passive range of motion without pain, normal range of motion and neck supple. No pain with movement, spinous process tenderness or muscular tenderness. Normal range of motion.     Right lower leg: No edema.     Left lower leg:  No edema.  Skin:    General: Skin is warm and dry.     Capillary Refill: Capillary refill takes less than 2 seconds.     Findings: No ecchymosis, erythema, lesion or wound.  Neurological:     Mental Status: He is alert and oriented to person, place, and time.     GCS: GCS eye subscore is 4. GCS verbal subscore is 5. GCS motor subscore is 6.     Cranial Nerves: Cranial nerves 2-12 are intact.     Sensory: Sensation is intact.     Motor: Motor function is intact. No weakness or abnormal muscle tone.     Coordination: Coordination is intact.  Psychiatric:        Mood and Affect: Mood normal.        Speech: Speech normal.        Behavior: Behavior normal.    ED Results / Procedures / Treatments   Labs (all labs ordered are listed, but only abnormal results are displayed) Labs Reviewed  BASIC METABOLIC PANEL -  Abnormal; Notable for the following components:      Result Value   Glucose, Bld 147 (*)    BUN 23 (*)    All other components within normal limits  CBC WITH DIFFERENTIAL/PLATELET  URINALYSIS, ROUTINE W REFLEX MICROSCOPIC  GC/CHLAMYDIA PROBE AMP ([place]) NOT AT [facility]    EKG None  Radiology US SCROTUM W/DOPPLER  Result Date: 04/07/2022 CLINICAL DATA:  Right testicle pain which began at 3 a.m. EXAM: SCROTAL ULTRASOUND DOPPLER ULTRASOUND OF THE TESTICLES TECHNIQUE: Complete ultrasound examination of the testicles, epididymis, and other scrotal structures was performed. Color and spectral Doppler ultrasound were also utilized to evaluate blood flow to the testicles. COMPARISON:  None Available. FINDINGS: Right testicle Measurements: 4.2 x 1.9 x 2.7 cm (volume = 11 cm^3). No mass or microlithiasis visualized. Absent color Doppler flow. Left testicle Measurements: 4.7 x 1.5 x 3.2 cm (volume = 12 cm^3). No mass or microlithiasis visualized. Normal color Doppler flow. Right epididymis: The right epididymis appears enlarged with no discernible blood flow on color Doppler. Left epididymis:  Tiny cyst noted measuring 3 mm. Hydrocele:  None visualized. Varicocele:  None visualized. Pulsed Doppler interrogation of both testes demonstrates normal low resistance arterial and venous waveforms to the left testicle. Absent arterial and venous waveforms identified to the right testicle. IMPRESSION: 1. Absence of color Doppler flow and arterial and venous waveforms to the right testis and right epididymis. Findings are concerning for testicular torsion. 2. Critical Value/emergent results were called by telephone at the time of interpretation on 04/07/2022 at 6:57 am to provider [facility] Danen Lapaglia , who verbally acknowledged these results. Electronically Signed   By: Signa Kellaylor  Stroud M.D.   On: 04/07/2022 06:58    Procedures Procedures    Medications Ordered in ED Medications  HYDROmorphone (DILAUDID) injection 1  mg (1 mg Intravenous Given 04/07/22 0600)  ondansetron (ZOFRAN) injection 4 mg (4 mg Intravenous Given 04/07/22 [id number])    ED Course/ Medical Decision Making/ A&P                           Medical Decision Making Amount and/or Complexity of Data Reviewed Labs: ordered. Radiology: ordered.  Risk Prescription drug management.   Patient presents to the emergency department for evaluation of right testicular pain and swelling.  Pain began at 3 AM.  He awakened from sleep with the pain.  Examination did reveal swelling, fullness,  tenderness of the right testicle.  Ultrasound was immediately obtained and shows no flow to the testicle.  Discussed with Dr. Urban Gibson, on-call for urology.  Agrees with sending patient to [facility].  We will need to call on-call urology when he arrives.  Patient administered analgesia here.  I did rotate the right testicle 180 degrees counterclockwise but it did not relieve any pain.        Final Clinical Impression(s) / ED Diagnoses Final diagnoses:  Testicular torsion    Rx / DC Orders ED Discharge Orders     None         Gilda Crease, MD 04/07/22 [phone]

## 2022-04-07 NOTE — ED Provider Notes (Signed)
Patient transferred with right testicle pain and confirmed torsion on ultrasound.  Patient declines further pain meds currently.   Urology to be notified of patient arrival.   Discussed with Dr. McDiarmid of urology on arrival.  He states he is not on-call.  Discussed with Dr. Mena Goes who will post patient for the OR.  CRITICAL CARE Performed by: Glynn Octave Total critical care time: 30 minutes Critical care time was exclusive of separately billable procedures and treating other patients. Critical care was necessary to treat or prevent imminent or life-threatening deterioration. Critical care was time spent personally by me on the following activities: development of treatment plan with patient and/or surrogate as well as nursing, discussions with consultants, evaluation of patient's response to treatment, examination of patient, obtaining history from patient or surrogate, ordering and performing treatments and interventions, ordering and review of laboratory studies, ordering and review of radiographic studies, pulse oximetry and re-evaluation of patient's condition.    Glynn Octave, MD 04/07/22 0830

## 2022-04-07 NOTE — ED Notes (Signed)
Pt instructed to go straight to [facility] ED. Transported POV with mom with IV in placed per Blinda Leatherwood, MD. Report given to [place], RN. Pt A&Ox4 at time of departure

## 2022-04-07 NOTE — Transfer of Care (Signed)
Immediate Anesthesia Transfer of Care Note  Patient: Chad Simmons  Procedure(s) Performed: ORCHIOPEXY ADULT (Bilateral)  Patient Location: PACU  Anesthesia Type:General  Level of Consciousness: awake, drowsy and patient cooperative  Airway & Oxygen Therapy: Patient Spontanous Breathing and Patient connected to face mask oxygen  Post-op Assessment: Report given to RN and Post -op Vital signs reviewed and stable  Post vital signs: Reviewed and stable  Last Vitals:  Vitals Value Taken Time  BP    Temp    Pulse 80 04/07/22 1015  Resp 17 04/07/22 1015  SpO2 100 % 04/07/22 1015  Vitals shown include unvalidated device data.  Last Pain:  Vitals:   04/07/22 0845  TempSrc:   PainSc: 0-No pain      Patients Stated Pain Goal: 4 (04/07/22 0845)  Complications: No notable events documented.

## 2022-04-08 ENCOUNTER — Encounter (HOSPITAL_COMMUNITY): Payer: Self-pay | Admitting: Urology

## 2022-04-08 LAB — GC/CHLAMYDIA PROBE AMP (~~LOC~~) NOT AT ARMC
Chlamydia: NEGATIVE
Comment: NEGATIVE
Comment: NORMAL
Neisseria Gonorrhea: NEGATIVE

## 2022-04-08 NOTE — Anesthesia Postprocedure Evaluation (Signed)
Anesthesia Post Note  Patient: Marguerite Jarboe  Procedure(s) Performed: ORCHIOPEXY ADULT (Bilateral)     Patient location during evaluation: PACU Anesthesia Type: General Level of consciousness: awake and alert Pain management: pain level controlled Vital Signs Assessment: post-procedure vital signs reviewed and stable Respiratory status: spontaneous breathing, nonlabored ventilation, respiratory function stable and patient connected to nasal cannula oxygen Cardiovascular status: blood pressure returned to baseline and stable Postop Assessment: no apparent nausea or vomiting Anesthetic complications: no   No notable events documented.  Last Vitals:  Vitals:   04/07/22 1045 04/07/22 1100  BP:  133/75  Pulse:  66  Resp:    Temp: 36.6 C 36.6 C  SpO2:  100%    Last Pain:  Vitals:   04/07/22 1100  TempSrc:   PainSc: 0-No pain   Pain Goal: Patients Stated Pain Goal: 4 (04/07/22 0845)                 Earl Lites P Moira Umholtz

## 2022-05-06 NOTE — Progress Notes (Deleted)
Patient ID: Chad Simmons, male    DOB: 14-Sep-1996  MRN: [id number]  CC: Prediabetes   Subjective: Chad Simmons is a 26 y.o. male who presents for prediabetes.   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     albuterol (VENTOLIN HFA) 108 (90 Base) MCG/ACT inhaler Inhale 2 puffs into the lungs every 4 (four) hours as needed for wheezing or shortness of breath (cough, shortness of breath or wheezing.). 18 g 5   cetirizine (ZYRTEC) 10 MG tablet Take 10 mg by mouth daily.     desonide (DESOWEN) 0.05 % cream Apply topically 2 (two) times daily. Use sparingly to the affected area 30 g 0   diclofenac (VOLTAREN) 75 MG EC tablet Take 75 mg by mouth 2 (two) times daily as needed.     EPINEPHrine 0.3 mg/0.3 mL IJ SOAJ injection Inject 0.3 mg into the muscle.     tiZANidine (ZANAFLEX) 4 MG tablet Take 2 mg by mouth 3 (three) times daily as needed.     No current facility-administered medications on file prior to visit.    Allergies  Allergen Reactions   Peanut-Containing Drug Products Anaphylaxis   Egg [Eggs Or Egg-Derived Products]     Social History   Socioeconomic History   Marital status: Single    Spouse name: Not on file   Number of children: Not on file   Years of education: Not on file   Highest education level: Not on file  Occupational History   Not on file  Tobacco Use   Smoking status: Never   Smokeless tobacco: Never  Vaping Use   Vaping Use: Never  used  Substance and Sexual Activity   Alcohol use: No   Drug use: No   Sexual activity: Yes  Other Topics Concern   Not on file  Social History Narrative   9th grade 2014-2015   Social Determinants of Health   Financial Resource Strain: Not on file  Food Insecurity: Not on file  Transportation Needs: Not on file  Physical Activity: Not on file  Stress: Not on file  Social Connections: Not on file  Intimate Partner Violence: Not on file    Family History  Problem Relation Age of Onset   Irritable bowel syndrome Mother    Cholelithiasis Neg Hx    Ulcers Neg Hx     Past Surgical History:  Procedure Laterality Date   ORCHIOPEXY Bilateral 04/07/2022   Procedure: ORCHIOPEXY ADULT;  Surgeon: Jerilee Field, MD;  Location: [facility];  Service: Urology;  Laterality: Bilateral;    ROS: Review of Systems Negative except as stated above  PHYSICAL EXAM: There were no vitals taken for this visit.  Physical Exam  {male adult master:310786} {male adult master:310785}     Latest Ref Rng & Units 04/07/2022    5:55 AM 04/30/2021   10:56 AM 01/25/2020   10:29 AM  CMP  Glucose 70 - 99 mg/dL 354  91  562  BUN 6 - 20 mg/dL 23  19  16    Creatinine 0.61 - 1.24 mg/dL  4.58  0.99   Sodium 135 - 145 mmol/L 138  138  139   Potassium 3.5 - 5.1 mmol/L 4.0  4.3  4.7   Chloride 98 - 111 mmol/L 103  100  104   CO2 22 - 32 mmol/L 24   20   Calcium 8.9 - 10.3 mg/dL 9.8  9.3  9.8   Total Protein 6.0 - 8.5 g/dL  7.2  7.6   Total Bilirubin 0.0 - 1.2 mg/dL  0.5  0.3   Alkaline Phos 44 - 121 IU/L  68  77   AST 0 - 40 IU/L  27  27   ALT 0 - 44 IU/L   18    Lipid Panel     Component Value Date/Time   CHOL 148 05/26/2021 0939   TRIG 37 05/26/2021 0939   HDL 57 05/26/2021 0939   CHOLHDL 2.6 05/26/2021 0939   LDLCALC 82 05/26/2021 0939    CBC    Component Value Date/Time   WBC 9.2 04/07/2022 0555   RBC 5.09 04/07/2022 0555   HGB 13.7 04/07/2022 0555   HGB 14.4 04/30/2021 1056    HCT 42.0 04/07/2022 0555   HCT 43.4 04/30/2021 1056   PLT 205 04/07/2022 0555   PLT 202 04/30/2021 1056   MCV 82.5 04/07/2022 0555   MCV 85 04/30/2021 1056   MCH 26.9 04/07/2022 0555   MCHC 32.6 04/07/2022 0555   RDW 13.8 04/07/2022 0555   RDW 13.4 04/30/2021 1056   LYMPHSABS 2.0 04/07/2022 0555   LYMPHSABS 2.3 04/30/2021 1056   MONOABS 0.5 04/07/2022 0555   EOSABS 0.2 04/07/2022 0555   EOSABS 0.0 04/30/2021 1056   BASOSABS 0.1 04/07/2022 0555   BASOSABS 0.0 04/30/2021 1056    ASSESSMENT AND PLAN:  There are no diagnoses linked to this encounter.   Patient was given the opportunity to ask questions.  Patient verbalized understanding of the plan and was able to repeat key elements of the plan. Patient was given clear instructions to go to Emergency Department or return to medical center if symptoms don't improve, worsen, or new problems develop.The patient verbalized understanding.   No orders of the defined types were placed in this encounter.    Requested Prescriptions    No prescriptions requested or ordered in this encounter    No follow-ups on file.  05/02/2021, NP

## 2022-05-14 ENCOUNTER — Ambulatory Visit: Payer: 59 | Admitting: Family

## 2022-05-14 DIAGNOSIS — R7303 Prediabetes: Secondary | ICD-10-CM

## 2022-05-15 ENCOUNTER — Ambulatory Visit
Admission: EM | Admit: 2022-05-15 | Discharge: 2022-05-15 | Disposition: A | Discharge status: Home / Self Care | Payer: 59 | Attending: Emergency Medicine | Admitting: Emergency Medicine

## 2022-05-15 DIAGNOSIS — J309 Allergic rhinitis, unspecified: Secondary | ICD-10-CM

## 2022-05-15 DIAGNOSIS — J4521 Mild intermittent asthma with (acute) exacerbation: Secondary | ICD-10-CM | POA: Diagnosis not present

## 2022-05-15 DIAGNOSIS — J4520 Mild intermittent asthma, uncomplicated: Secondary | ICD-10-CM

## 2022-05-15 MED ORDER — FLUTICASONE PROPIONATE 50 MCG/ACT NA SUSP: 1.0000 | Freq: Every day | NASAL | 2 refills | Status: DC

## 2022-05-15 MED ORDER — CETIRIZINE HCL 10 MG PO TABS: 10.0000 mg | ORAL_TABLET | Freq: Every day | ORAL | 1 refills | Status: DC

## 2022-05-15 MED ORDER — METHYLPREDNISOLONE 4 MG PO TBPK
ORAL_TABLET | ORAL | 0 refills | Status: DC
Start: 2022-05-15 — End: 2022-11-10

## 2022-05-15 MED ORDER — ALBUTEROL SULFATE HFA 108 (90 BASE) MCG/ACT IN AERS: 2.0000 | INHALATION_SPRAY | RESPIRATORY_TRACT | 1 refills | Status: AC | PRN

## 2022-05-15 NOTE — Discharge Instructions (Signed)
Your symptoms and my physical exam findings are concerning for exacerbation of your underlying allergies.     Please see the list below for recommended medications, dosages and frequencies to provide relief of current symptoms:     Medrol Dosepak (methylprednisolone): This is a steroid that will significantly calm your upper and lower airways, please take one row of tablets daily with your breakfast meal starting tomorrow morning until the prescription is complete.      Zyrtec (cetirizine): This is an excellent second-generation antihistamine that helps to reduce respiratory inflammatory response to environmental allergens.  In some patients, this medication can cause daytime sleepiness so I recommend that you take 1 tablet daily at bedtime.     Flonase (fluticasone): This is a steroid nasal spray that you use once daily, 1 spray in each nare.  This medication does not work well if you decide to use it only used as you feel you need to, it works best used on a daily basis.  After 3 to 5 days of use, you will notice significant reduction of the inflammation and mucus production that is currently being caused by exposure to allergens, whether seasonal or environmental.  The most common side effect of this medication is nosebleeds.  If you experience a nosebleed, please discontinue use for 1 week, then feel free to resume.  I have provided you with a prescription.     ProAir, Ventolin, Proventil (albuterol): This inhaled medication contains a short acting beta agonist bronchodilator.  This medication works on the smooth muscle that opens and constricts of your airways by relaxing the muscle.  The result of relaxation of the smooth muscle is increased air movement and improved work of breathing.  This is a short acting medication that can be used every 4-6 hours as needed for increased work of breathing, shortness of breath, wheezing and excessive coughing.  I have provided you with a prescription.   Please  follow-up within the next 5-7 days either with your primary care provider or urgent care if your symptoms do not resolve.  If you do not have a primary care provider, we will assist you in finding one.   Thank you for visiting urgent care today.  We appreciate the opportunity to participate in your care.

## 2022-05-15 NOTE — ED Triage Notes (Addendum)
Pt c/o cough that has gotten worse, he states the cough is now hurting his chest. The patient states this has been going on got a few days and he has also had muscular spasms for a few weeks.

## 2022-05-15 NOTE — ED Provider Notes (Signed)
[facility]    CSN: [id number] Arrival date & time: 05/15/22  1145    HISTORY   Chief Complaint  Patient presents with   Cough   Spasms   HPI Chad Simmons is a pleasant, 26 y.o. male who presents to urgent care today complaining of cough that has gotten worse over the past few weeks.  Patient has a history of allergies and asthma, not currently using medication for either issue.  Patient states he is coughing so hard that his chest is hurting.  Patient states he has been having spasms of his muscles along with twitching for the past few weeks as well.  The history is provided by the patient.   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 Authorizing Provider  albuterol (VENTOLIN HFA) 108 (90 Base) MCG/ACT inhaler Inhale 2 puffs into the lungs every 4 (four) hours as needed for wheezing or shortness of breath (cough, shortness of breath or wheezing.). 01/25/20   Arvilla Market, MD  cetirizine (ZYRTEC) 10 MG tablet Take 10 mg by mouth daily.    [provider]  desonide (DESOWEN) 0.05 % cream Apply topically 2 (two) times daily. Use sparingly to the affected area 07/28/20   Cathie Hoops, Amy V, PA-C   EPINEPHrine 0.3 mg/0.3 mL IJ SOAJ injection Inject 0.3 mg into the muscle. 12/03/15   [provider]    Family History Family History  Problem Relation Age of Onset   Irritable bowel syndrome Mother    Cholelithiasis Neg Hx    Ulcers Neg Hx    Social History Social History   Tobacco Use   Smoking status: Never   Smokeless tobacco: Never  Vaping Use   Vaping Use: Never used  Substance Use Topics   Alcohol use: No   Drug use: No   Allergies   Peanut-containing drug products and Egg [eggs or egg-derived products]  Review of Systems Review of Systems Pertinent findings revealed after performing a 14 point review of systems has been noted in the history of present illness.  Physical Exam Triage Vital Signs ED Triage Vitals  Enc Vitals Group     BP 08/28/21 0827 (!) 147/82     Pulse Rate 08/28/21 0827 72     Resp 08/28/21 0827 18     Temp 08/28/21 0827 98.3 F (36.8 C)     Temp Source 08/28/21 0827 Oral     SpO2 08/28/21 0827 98 %     Weight --      Height --      Head Circumference --      Peak Flow --      Pain Score 08/28/21 0826  5     Pain Loc --      Pain Edu? --      Excl. in GC? --   No data found.  Updated Vital Signs BP 125/78 (BP Location: Left Arm)   Pulse 68   Temp 98.5 F (36.9 C) (Oral)   Resp 16   SpO2 97%   Physical Exam Vitals and nursing note reviewed.  Constitutional:      General: He is not in acute distress.    Appearance: Normal appearance. He is not ill-appearing.  HENT:     Head: Normocephalic and atraumatic.     Salivary Glands: Right salivary gland is not diffusely enlarged or tender. Left salivary gland is not diffusely enlarged or tender.     Right Ear: Ear canal and external ear normal. No drainage. A middle ear effusion is present. There is no impacted cerumen. Tympanic membrane is bulging. Tympanic membrane is not injected or erythematous.     Left Ear: Ear canal and external ear normal. No drainage. A middle ear  effusion is present. There is no impacted cerumen. Tympanic membrane is bulging. Tympanic membrane is not injected or erythematous.     Ears:     Comments: Bilateral EACs normal, both TMs bulging with clear fluid    Nose: Rhinorrhea present. No nasal deformity, septal deviation, signs of injury, nasal tenderness, mucosal edema or congestion. Rhinorrhea is clear.     Right Nostril: Occlusion present. No foreign body, epistaxis or septal hematoma.     Left Nostril: Occlusion present. No foreign body, epistaxis or septal hematoma.     Right Turbinates: Enlarged, swollen and pale.     Left Turbinates: Enlarged, swollen and pale.     Right Sinus: No maxillary sinus tenderness or frontal sinus tenderness.     Left Sinus: No maxillary sinus tenderness or frontal sinus tenderness.     Mouth/Throat:     Lips: Pink. No lesions.     Mouth: Mucous membranes are moist. No oral lesions.     Pharynx: Oropharynx is clear. Uvula midline. No posterior oropharyngeal erythema or uvula swelling.     Tonsils: No tonsillar exudate. 0 on the right. 0 on the left.     Comments: Postnasal drip Eyes:     General: Lids are normal.        Right eye: No discharge.        Left eye: No discharge.     Extraocular Movements: Extraocular movements intact.     Conjunctiva/sclera: Conjunctivae normal.     Right eye: Right conjunctiva is not injected.     Left eye: Left conjunctiva is not injected.  Neck:     Trachea: Trachea and phonation normal.  Cardiovascular:     Rate and Rhythm: Normal rate and regular rhythm.     Pulses: Normal pulses.     Heart sounds: Normal heart sounds. No murmur heard.    No friction rub. No gallop.  Pulmonary:     Effort: Pulmonary effort is normal. No accessory muscle usage, prolonged expiration or respiratory distress.     Breath sounds: No stridor, decreased air movement or transmitted upper airway sounds. Examination of the right-lower field reveals wheezing. Examination of the  left-lower field reveals wheezing. Wheezing present. No decreased breath sounds, rhonchi or rales.  Chest:     Chest wall: No tenderness.  Musculoskeletal:        General: Normal range of motion.     Cervical back: Normal range of motion  and neck supple. Normal range of motion.  Lymphadenopathy:     Cervical: No cervical adenopathy.  Skin:    General: Skin is warm and dry.     Findings: No erythema or rash.  Neurological:     General: No focal deficit present.     Mental Status: He is alert and oriented to person, place, and time.  Psychiatric:        Mood and Affect: Mood normal.        Behavior: Behavior normal.     Visual Acuity Right Eye Distance:   Left Eye Distance:   Bilateral Distance:    Right Eye Near:   Left Eye Near:    Bilateral Near:     [facility] Couse / Diagnostics / Procedures:     Radiology No results found.  Procedures Procedures (including critical care time) EKG  Pending results:  Labs Reviewed - No data to display  Medications Ordered in UC: Medications - No data to display  UC Diagnoses / Final Clinical Impressions(s)   I have reviewed the triage vital signs and the nursing notes.  Pertinent labs & imaging results that were available during my care of the patient were reviewed by me and considered in my medical decision making (see chart for details).    Final diagnoses:  Mild intermittent asthma with (acute) exacerbation  Allergic rhinitis, unspecified seasonality, unspecified trigger   Patient advised to resume allergy medications and use albuterol regularly and preventatively.  Patient also provided with a Medrol Dosepak for mild wheezing at lung bases.  Return precautions advised.  ED Prescriptions     Medication Sig Dispense Auth. Provider   albuterol (VENTOLIN HFA) 108 (90 Base) MCG/ACT inhaler Inhale 2 puffs into the lungs every 4 (four) hours as needed for wheezing or shortness of breath (cough, shortness of breath or wheezing.). 18 g  Theadora Rama Scales, PA-C   cetirizine (ZYRTEC) 10 MG tablet Take 1 tablet (10 mg total) by mouth daily. 90 tablet Theadora Rama Scales, PA-C   methylPREDNISolone (MEDROL DOSEPAK) 4 MG TBPK tablet Take 24 mg on day 1, 20 mg on day 2, 16 mg on day 3, 12 mg on day 4, 8 mg on day 5, 4 mg on day 6.  Take all tablets in each row at once, do not spread tablets out throughout the day. 21 tablet Theadora Rama Scales, PA-C   fluticasone (FLONASE) 50 MCG/ACT nasal spray Place 1 spray into both nostrils daily. Begin by using 2 sprays in each nare daily for 3 to 5 days, then decrease to 1 spray in each nare daily. 15.8 mL Theadora Rama Scales, PA-C      PDMP not reviewed this encounter.  Disposition Upon Discharge:  Condition: stable for discharge home Home: take medications as prescribed; routine discharge instructions as discussed; follow up as advised.  Patient presented with an acute illness with associated systemic symptoms and significant discomfort requiring urgent management. In my opinion, this is a condition that a prudent lay person (someone who possesses an average knowledge of health and medicine) may potentially expect to result in complications if not addressed urgently such as respiratory distress, impairment of bodily function or dysfunction of bodily organs.   Routine symptom specific, illness specific and/or disease specific instructions were discussed with the patient and/or caregiver at length.   As such, the patient has been evaluated and assessed, work-up was performed and treatment was provided in alignment with urgent care protocols and evidence based medicine.  Patient/parent/caregiver  has been advised that the patient may require follow up for further testing and treatment if the symptoms continue in spite of treatment, as clinically indicated and appropriate.  If the patient was tested for COVID-19, Influenza and/or RSV, then the patient/parent/guardian was advised to  isolate at home pending the results of his/her diagnostic coronavirus test and potentially longer if they're positive. I have also advised pt that if his/her COVID-19 test returns positive, it's recommended to self-isolate for at least 10 days after symptoms first appeared AND until fever-free for 24 hours without fever reducer AND other symptoms have improved or resolved. Discussed self-isolation recommendations as well as instructions for household member/close contacts as per the [facility] and [place] [facility], and also gave patient the COVID packet with this information.  Patient/parent/caregiver has been advised to return to the [facility] or PCP in 3-5 days if no better; to PCP or the Emergency Department if new signs and symptoms develop, or if the current signs or symptoms continue to change or worsen for further workup, evaluation and treatment as clinically indicated and appropriate  The patient will follow up with their current PCP if and as advised. If the patient does not currently have a PCP we will assist them in obtaining one.   The patient may need specialty follow up if the symptoms continue, in spite of conservative treatment and management, for further workup, evaluation, consultation and treatment as clinically indicated and appropriate.  Patient/parent/caregiver verbalized understanding and agreement of plan as discussed.  All questions were addressed during visit.  Please see discharge instructions below for further details of plan.  Discharge Instructions:   Discharge Instructions      Your symptoms and my physical exam findings are concerning for exacerbation of your underlying allergies.     Please see the list below for recommended medications, dosages and frequencies to provide relief of current symptoms:     Medrol Dosepak (methylprednisolone): This is a steroid that will significantly calm your upper and lower airways, please take one row of tablets daily with your breakfast meal starting  tomorrow morning until the prescription is complete.      Zyrtec (cetirizine): This is an excellent second-generation antihistamine that helps to reduce respiratory inflammatory response to environmental allergens.  In some patients, this medication can cause daytime sleepiness so I recommend that you take 1 tablet daily at bedtime.     Flonase (fluticasone): This is a steroid nasal spray that you use once daily, 1 spray in each nare.  This medication does not work well if you decide to use it only used as you feel you need to, it works best used on a daily basis.  After 3 to 5 days of use, you will notice significant reduction of the inflammation and mucus production that is currently being caused by exposure to allergens, whether seasonal or environmental.  The most common side effect of this medication is nosebleeds.  If you experience a nosebleed, please discontinue use for 1 week, then feel free to resume.  I have provided you with a prescription.     ProAir, Ventolin, Proventil (albuterol): This inhaled medication contains a short acting beta agonist bronchodilator.  This medication works on the smooth muscle that opens and constricts of your airways by relaxing the muscle.  The result of relaxation of the smooth muscle is increased air movement and improved work of breathing.  This is a short acting medication that can be used every 4-6 hours as needed for increased work  of breathing, shortness of breath, wheezing and excessive coughing.  I have provided you with a prescription.   Please follow-up within the next 5-7 days either with your primary care provider or urgent care if your symptoms do not resolve.  If you do not have a primary care provider, we will assist you in finding one.   Thank you for visiting urgent care today.  We appreciate the opportunity to participate in your care.       This office note has been dictated using Teaching laboratory technicianDragon speech recognition software.  Unfortunately, this  method of dictation can sometimes lead to typographical or grammatical errors.  I apologize for your inconvenience in advance if this occurs.  Please do not hesitate to reach out to me if clarification is needed.      Theadora RamaMorgan, Ladaisha Portillo Scales, PA-C 05/15/22 1234

## 2022-05-21 ENCOUNTER — Ambulatory Visit: Payer: 59 | Admitting: Internal Medicine

## 2022-06-04 ENCOUNTER — Emergency Department (HOSPITAL_BASED_OUTPATIENT_CLINIC_OR_DEPARTMENT_OTHER)
Admission: EM | Admit: 2022-06-04 | Discharge: 2022-06-04 | Disposition: A | Discharge status: Home / Self Care | Payer: 59 | Attending: Emergency Medicine | Admitting: Emergency Medicine

## 2022-06-04 ENCOUNTER — Ambulatory Visit
Admission: EM | Admit: 2022-06-04 | Discharge: 2022-06-04 | Disposition: A | Discharge status: Home / Self Care | Payer: 59

## 2022-06-04 ENCOUNTER — Encounter (HOSPITAL_BASED_OUTPATIENT_CLINIC_OR_DEPARTMENT_OTHER): Payer: Self-pay | Admitting: Obstetrics and Gynecology

## 2022-06-04 ENCOUNTER — Emergency Department (HOSPITAL_BASED_OUTPATIENT_CLINIC_OR_DEPARTMENT_OTHER): Payer: 59

## 2022-06-04 DIAGNOSIS — S0990XA Unspecified injury of head, initial encounter: Secondary | ICD-10-CM | POA: Diagnosis present

## 2022-06-04 DIAGNOSIS — Y92096 Garden or yard of other non-institutional residence as the place of occurrence of the external cause: Secondary | ICD-10-CM | POA: Diagnosis not present

## 2022-06-04 DIAGNOSIS — R944 Abnormal results of kidney function studies: Secondary | ICD-10-CM | POA: Diagnosis not present

## 2022-06-04 DIAGNOSIS — W01198A Fall on same level from slipping, tripping and stumbling with subsequent striking against other object, initial encounter: Secondary | ICD-10-CM | POA: Insufficient documentation

## 2022-06-04 DIAGNOSIS — Z7951 Long term (current) use of inhaled steroids: Secondary | ICD-10-CM | POA: Diagnosis not present

## 2022-06-04 DIAGNOSIS — E162 Hypoglycemia, unspecified: Secondary | ICD-10-CM | POA: Diagnosis not present

## 2022-06-04 DIAGNOSIS — R55 Syncope and collapse: Secondary | ICD-10-CM

## 2022-06-04 DIAGNOSIS — S060X1A Concussion with loss of consciousness of 30 minutes or less, initial encounter: Secondary | ICD-10-CM | POA: Insufficient documentation

## 2022-06-04 DIAGNOSIS — J45909 Unspecified asthma, uncomplicated: Secondary | ICD-10-CM | POA: Insufficient documentation

## 2022-06-04 DIAGNOSIS — Z91010 Allergy to peanuts: Secondary | ICD-10-CM | POA: Diagnosis not present

## 2022-06-04 LAB — BASIC METABOLIC PANEL
Anion gap: 9 (ref 5–15)
BUN: 21 mg/dL — ABNORMAL HIGH (ref 6–20)
CO2: 30 mmol/L (ref 22–32)
Calcium: 9.9 mg/dL (ref 8.9–10.3)
Chloride: 101 mmol/L (ref 98–111)
Creatinine, Ser: 1.25 mg/dL — ABNORMAL HIGH (ref 0.61–1.24)
GFR, Estimated: >60 mL/min (ref >60)
Glucose, Bld: 66 mg/dL — ABNORMAL LOW (ref 70–99)
Potassium: 4.2 mmol/L (ref 3.5–5.1)
Sodium: 140 mmol/L (ref 135–145)

## 2022-06-04 LAB — URINALYSIS, ROUTINE W REFLEX MICROSCOPIC
Bilirubin Urine: NEGATIVE
Glucose, UA: NEGATIVE mg/dL
Hgb urine dipstick: NEGATIVE
Ketones, ur: NEGATIVE mg/dL
Leukocytes,Ua: NEGATIVE
Nitrite: NEGATIVE
Protein, ur: NEGATIVE mg/dL
Specific Gravity, Urine: 1.023 (ref 1.005–1.030)
pH: 6 (ref 5.0–8.0)

## 2022-06-04 LAB — CBC
HCT: 44.8 % (ref 39.0–52.0)
Hemoglobin: 14.3 g/dL (ref 13.0–17.0)
MCH: 26.7 pg (ref 26.0–34.0)
MCHC: 31.9 g/dL (ref 30.0–36.0)
MCV: 83.7 fL (ref 80.0–100.0)
Platelets: 212 10*3/uL (ref 150–400)
RBC: 5.35 MIL/uL (ref 4.22–5.81)
RDW: 13.8 % (ref 11.5–15.5)
WBC: 6.8 10*3/uL (ref 4.0–10.5)
nRBC: 0 % (ref 0.0–0.2)

## 2022-06-04 LAB — RAPID URINE DRUG SCREEN, HOSP PERFORMED
Amphetamines: NOT DETECTED
Barbiturates: NOT DETECTED
Benzodiazepines: NOT DETECTED
Cocaine: NOT DETECTED
Opiates: NOT DETECTED
Tetrahydrocannabinol: NOT DETECTED

## 2022-06-04 LAB — CBG MONITORING, ED: Glucose-Capillary: 122 mg/dL — ABNORMAL HIGH (ref 70–99)

## 2022-06-04 MED ORDER — SODIUM CHLORIDE 0.9 % IV BOLUS
1000.0000 mL | Freq: Once | INTRAVENOUS | Status: AC
  Administered 2022-06-04: 1000 mL via INTRAVENOUS

## 2022-06-04 NOTE — ED Triage Notes (Signed)
Pt c/o syncope episode after getting out of truck approx 30 mins ago states he fell face first onto ground. States he was stretching right before the LOC.

## 2022-06-04 NOTE — Discharge Instructions (Addendum)
You were examined today for a head injury and possible concussion. Your head CT showed no evidence of Injury today. Based on your exam head imaging was not deemed necessary today.  Your kidney function is slightly elevated today and suggest dehydration.  Please make sure you are drinking plenty of fluids.  Your blood sugar was a bit low today which could have contributed to your passing out episode.  Make sure you are eating regularly.  Return if you have additional episodes of passing out, chest pain, shortness of breath or other new or concerning symptoms.  Sometimes serious problems can develop after a head injury. Please return to the emergency department if you experience any of the following symptoms: Repeated vomiting Headache that gets worse and does not go away Loss of consciousness or inability to stay awake at times when you normally would be able to Getting more confused, restless or agitated Convulsions or seizures Difficulty walking or feeling off balance Weakness or numbness Vision changes A concussion is a very mild traumatic brain injury caused by a bump, jolt or blow to the head, most people recover quickly and fully. You can experience a wide variety of symptoms including:   - Confusion      - Difficulty concentrating       - Trouble remembering new info  - Headache      - Dizziness        - Fuzzy or blurry vision  - Fatigue      - Balance problems      - Light sensitivity  - Mood swings     - Changes in sleep or difficulty sleeping   To help these symptoms improve make sure you are getting plenty of rest, avoid screen time, loud music and strenuous mental activities. Avoid any strenuous physical activities, once your symptoms have resolved a slow and gradual return to activity is recommended. It is very important that you avoid situations in which you might sustain a second head injury as this can be very dangerous and life threatening. You cannot be medically cleared to  return to normal activities until you have followed up with your primary doctor or a concussion specialist for reevaluation.

## 2022-06-04 NOTE — Discharge Instructions (Signed)
  Please report to ED immediately after leaving urgent care clinic.   [facility]  [address] [place], [place] [address]

## 2022-06-04 NOTE — ED Triage Notes (Signed)
Patient reports he had a syncopal episode in his yard. Reports he was out in the yard and was stretching and then passed out. Patient reports he hit the ground and does not remember how long he was down, but thinks it was less than a minute

## 2022-06-04 NOTE — ED Notes (Signed)
Patient verbalizes understanding of discharge instructions. Opportunity for questioning and answers were provided. Armband removed by staff, pt discharged from ED to home via POV  

## 2022-06-04 NOTE — ED Provider Notes (Signed)
[facility] EMERGENCY DEPT Provider Note   CSN: [id number] Arrival date & time: 06/04/22  1620     History  Chief Complaint  Patient presents with   Loss of Consciousness    Chad Simmons is a 26 y.o. male.  Chad Simmons is a 26 y.o. male with a history of asthma and eczema, who presents to the emergency department for evaluation of syncope.  Patient reports earlier today he returned home from [employer] training and got out of his truck, went to stretch and then passed out in the front yard.  He reports that he landed in the grass, and fell forward when he passed out.  No one witnessed the event, but he does not think he was unconscious for more than a minute.  He was able to get up and go inside and told his mom what had happened.  She tried to look on the ring camera but it did not capture it.  She initially took him to urgent care but they recommended for further evaluation.  He reports that it felt like his vision went black just prior to passing out but otherwise no other prodromal symptoms.  He denies any chest pain, shortness of breath or palpitations before or after passing out.  Reports that he has been eating and drinking throughout the day today and reports that they were not doing strenuous activity at the [employer] today but had been in the classroom.  He is unsure if he hit his head during the fall, but woke up face down on the ground.  No tongue injury or incontinence and no period of confusion after the fall.  Reports that he feels like his thinking is slowed since the incident, but otherwise denies headache, visual changes, nausea, vomiting, dizziness, numbness or weakness.  Reports otherwise he feels completely normal.  No prior history of syncope.  No family history of cardiac issues or sudden cardiac arrest.  The history is provided by the patient and a parent.  Loss of Consciousness Associated symptoms: no chest pain, no dizziness, no fever, no headaches,  no nausea, no palpitations, no seizures, no shortness of breath, no vomiting and no weakness        Home Medications Prior to Admission medications   Medication Sig Start Date End Date Taking? Authorizing Provider  albuterol (VENTOLIN HFA) 108 (90 Base) MCG/ACT inhaler Inhale 2 puffs into the lungs every 4 (four) hours as needed for wheezing or shortness of breath (cough, shortness of breath or wheezing.). 05/15/22   Theadora Rama Scales, PA-C  cetirizine (ZYRTEC) 10 MG tablet Take 1 tablet (10 mg total) by mouth daily. 05/15/22 11/11/22  Theadora Rama Scales, PA-C  desonide (DESOWEN) 0.05 % cream Apply topically 2 (two) times daily. Use sparingly to the affected area 07/28/20   Cathie Hoops, Amy V, PA-C  EPINEPHrine 0.3 mg/0.3 mL IJ SOAJ injection Inject 0.3 mg into the muscle. 12/03/15   [provider]  fluticasone (FLONASE) 50 MCG/ACT nasal spray Place 1 spray into both nostrils daily. Begin by using 2 sprays in each nare daily for 3 to 5 days, then decrease to 1 spray in each nare daily. 05/15/22   Theadora Rama Scales, PA-C  methylPREDNISolone (MEDROL DOSEPAK) 4 MG TBPK tablet Take 24 mg on day 1, 20 mg on day 2, 16 mg on day 3, 12 mg on day 4, 8 mg on day 5, 4 mg on day 6.  Take all tablets in each row at once, do not  spread tablets out throughout the day. 05/15/22   Lynden Oxford Scales, PA-C      Allergies    Peanut-containing drug products and Egg [eggs or egg-derived products]    Review of Systems   Review of Systems  Constitutional:  Negative for chills and fever.  HENT: Negative.    Respiratory:  Negative for cough and shortness of breath.   Cardiovascular:  Positive for syncope. Negative for chest pain and palpitations.  Gastrointestinal:  Negative for abdominal pain, nausea and vomiting.  Musculoskeletal:  Negative for arthralgias, back pain and neck pain.  Neurological:  Positive for syncope. Negative for dizziness, seizures, speech difficulty, weakness, light-headedness,  numbness and headaches.  Psychiatric/Behavioral:  Positive for decreased concentration.   All other systems reviewed and are negative.   Physical Exam Updated Vital Signs BP 125/70   Pulse (!) 59   Temp 98.1 F (36.7 C)   Resp 14   Ht 5\' 8"  (1.727 m)   Wt 86.2 kg   SpO2 100%   BMI 28.89 kg/m  Physical Exam Vitals and nursing note reviewed.  Constitutional:      General: He is not in acute distress.    Appearance: Normal appearance. He is well-developed. He is not diaphoretic.  HENT:     Head: Normocephalic and atraumatic.     Comments: No hematoma, step-off or deformity    Nose: Nose normal.     Comments: No tenderness or deformity    Mouth/Throat:     Mouth: Mucous membranes are moist.     Pharynx: Oropharynx is clear.     Comments: No jaw tenderness, no malocclusion, no broken or missing teeth Eyes:     General:        Right eye: No discharge.        Left eye: No discharge.     Extraocular Movements: Extraocular movements intact.     Pupils: Pupils are equal, round, and reactive to light.  Cardiovascular:     Rate and Rhythm: Normal rate and regular rhythm.     Pulses: Normal pulses.     Heart sounds: Normal heart sounds.  Pulmonary:     Effort: Pulmonary effort is normal. No respiratory distress.     Breath sounds: Normal breath sounds. No wheezing or rales.     Comments: Respirations equal and unlabored, patient able to speak in full sentences, lungs clear to auscultation bilaterally  Chest:     Chest wall: No tenderness.  Abdominal:     General: Bowel sounds are normal. There is no distension.     Palpations: Abdomen is soft. There is no mass.     Tenderness: There is no abdominal tenderness. There is no guarding.     Comments: Abdomen soft, nondistended, nontender to palpation in all quadrants without guarding or peritoneal signs  Musculoskeletal:        General: No deformity.     Cervical back: Neck supple.  Skin:    General: Skin is warm and dry.      Capillary Refill: Capillary refill takes less than 2 seconds.  Neurological:     Mental Status: He is alert and oriented to person, place, and time.     Coordination: Coordination normal.     Comments: Speech is clear, a bit slow to answer questions but able to follow commands and alert and oriented CN III-XII intact Normal strength in upper and lower extremities bilaterally including dorsiflexion and plantar flexion, strong and equal grip strength Sensation normal to  light and sharp touch Moves extremities without ataxia, coordination intact  Psychiatric:        Mood and Affect: Mood normal.        Behavior: Behavior normal.     ED Results / Procedures / Treatments   Labs (all labs ordered are listed, but only abnormal results are displayed) Labs Reviewed  BASIC METABOLIC PANEL - Abnormal; Notable for the following components:      Result Value   Glucose, Bld 66 (*)    BUN 21 (*)    Creatinine, Ser 1.25 (*)    All other components within normal limits  CBG MONITORING, ED - Abnormal; Notable for the following components:   Glucose-Capillary 122 (*)    All other components within normal limits  CBC  URINALYSIS, ROUTINE W REFLEX MICROSCOPIC  RAPID URINE DRUG SCREEN, HOSP PERFORMED    EKG EKG Interpretation  Date/Time:  Friday June 04 2022 16:36:07 EDT Ventricular Rate:  58 PR Interval:  164 QRS Duration: 104 QT Interval:  400 QTC Calculation: 392 R Axis:   76 Text Interpretation: Sinus bradycardia Nonspecific ST abnormality Abnormal ECG No previous ECGs available Confirmed by Vonita Moss ([id number]) on 06/04/2022 7:57:00 PM  Radiology CT Head Wo Contrast  Result Date: 06/04/2022 CLINICAL DATA:  Syncope. EXAM: CT HEAD WITHOUT CONTRAST TECHNIQUE: Contiguous axial images were obtained from the base of the skull through the vertex without intravenous contrast. RADIATION DOSE REDUCTION: This exam was performed according to the departmental dose-optimization program which  includes automated exposure control, adjustment of the mA and/or kV according to patient size and/or use of iterative reconstruction technique. COMPARISON:  None Available. FINDINGS: Brain: No evidence of acute infarction, hemorrhage, hydrocephalus, extra-axial collection or mass lesion/mass effect. Vascular: No hyperdense vessel or unexpected calcification. Skull: Normal. Negative for fracture or focal lesion. Sinuses/Orbits: No acute finding. Other: None. IMPRESSION: No acute intracranial pathology. Electronically Signed   By: Aram Candela M.D.   On: 06/04/2022 18:53    Procedures Procedures    Medications Ordered in ED Medications  sodium chloride 0.9 % bolus 1,000 mL (0 mLs Intravenous Stopped 06/04/22 1922)    ED Course/ Medical Decision Making/ A&P                           Medical Decision Making Amount and/or Complexity of Data Reviewed Labs: ordered. Radiology: ordered.   26 y.o. male presents to the ED with complaints of syncope, this involves an extensive number of treatment options, and is a complaint that carries with it a high risk of complications and morbidity.  The differential diagnosis includes vasovagal syncope, dehydration, hypoglycemia, seizure, arrhythmia  On arrival pt is nontoxic, vitals WNL. Exam overall reassuring, patient does seem to have some difficulty concentrating and slowed thinking.  Concern for concussion in the setting of head injury or more severe intracranial injury, also considered hypoglycemia.  Additional history obtained from mother. Previous records obtained and reviewed   I ordered medication including IV fluid bolus  Lab Tests:  I Ordered, reviewed, and interpreted labs, which included: No leukocytosis and normal hemoglobin, glucose on initial BMP of 66, patient given food and drink and this improved to 122, mild elevation in creatinine and BUN at 125 and 21, suggestive of dehydration.  UA and UDS clear.  Imaging Studies ordered:  I  ordered imaging studies which included CT head, I independently visualized and interpreted imaging which showed no skull fracture, bleeding or other acute intracranial  abnormalities  ED Course:   Patient presents with syncopal episode at, likely with head injury.  Had some difficulty concentrating and was slow to answer questions after incident.  No chest pain, shortness of breath or palpitations, before or after the event.  Vitals normal on upon arrival and patient with no focal neurologic deficits.  CT head is clear, could certainly have concussion from head injury.  Patient also had mild hypoglycemia upon arrival this resolved with eating and drinking.  Do not feel that patient will require admission for syncope.  EKG is reassuring.  Provided concussion precautions and encouraged to rest over the next few days and avoid strenuous physical activity or mental stimulation.  Provided work note and discussed return precautions and outpatient follow-up with PCP or concussion clinic.  Patient and mom expressed understanding and agreement.  Discharged home in good condition.  Portions of this note were generated with [employer]. Dictation errors may occur despite best attempts at proofreading.         Final Clinical Impression(s) / ED Diagnoses Final diagnoses:  Syncope, unspecified syncope type  Closed head injury with concussion, with loss of consciousness of 30 minutes or less, initial encounter  Hypoglycemia    Rx / DC Orders ED Discharge Orders     None         Janet Berlin 06/05/22 [id number]    Fransico Meadow, MD 06/07/22 1140

## 2022-06-04 NOTE — ED Provider Notes (Signed)
Patient here today for evaluation of unwitnessed syncopal episode that occurred after he got out of his truck earlier and stretched. He notes he then woke up on the ground. He denies shortness of breath or chest pain but states he did feel as if the wind had been knocked out of him with the fall. I recommended further evaluation in the ED. Patient is agreeable to same and mother is here with him for transport.     Tomi Bamberger, PA-C 06/04/22 1711

## 2022-07-23 ENCOUNTER — Encounter: Payer: Self-pay | Admitting: Emergency Medicine

## 2022-07-23 ENCOUNTER — Ambulatory Visit
Admission: EM | Admit: 2022-07-23 | Discharge: 2022-07-23 | Disposition: A | Discharge status: Home / Self Care | Payer: 59 | Attending: Internal Medicine | Admitting: Internal Medicine

## 2022-07-23 DIAGNOSIS — Z20822 Contact with and (suspected) exposure to covid-19: Secondary | ICD-10-CM | POA: Diagnosis present

## 2022-07-23 DIAGNOSIS — J069 Acute upper respiratory infection, unspecified: Secondary | ICD-10-CM | POA: Diagnosis present

## 2022-07-23 DIAGNOSIS — M546 Pain in thoracic spine: Secondary | ICD-10-CM | POA: Diagnosis present

## 2022-07-23 LAB — RESP PANEL BY RT-PCR (FLU A&B, COVID) ARPGX2
Influenza A by PCR: NEGATIVE
Influenza B by PCR: NEGATIVE
SARS Coronavirus 2 by RT PCR: POSITIVE — AB

## 2022-07-23 NOTE — Discharge Instructions (Addendum)
I am suspicious that you have COVID-19.  COVID test is pending.  We will call if it is positive.  You do have a viral illness that is causing your symptoms which should run the course and self resolve with symptomatic treatment as we discussed.  Your back pain appears to be a muscle strain versus body aches.

## 2022-07-23 NOTE — ED Provider Notes (Signed)
[facility]    CSN: [id number] Arrival date & time: 07/23/22  1010      History   Chief Complaint Chief Complaint  Patient presents with   Chills    Mom has covid and I have symptoms but woke up with pain in neck as well - Entered by patient   Sore Throat   Fever   Back Pain    HPI Chad Simmons is a 26 y.o. male.   Patient presents with headache, sore throat, chills, nasal congestion, cough that started about 3 days ago.  Patient reports that a family member recently tested positive for COVID-19.  Denies chest pain, shortness of breath, nausea, vomiting, diarrhea, abdominal pain.  Patient has taken Tylenol for fever approximately 1 hour prior to arrival.  Also reporting that he has right upper back pain that started this morning.  Denies any obvious injury.  Denies numbness or tingling.  Movement exacerbates pain.   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 Authorizing Provider  albuterol (VENTOLIN HFA) 108 (90 Base) MCG/ACT inhaler Inhale 2 puffs into the lungs every 4 (four) hours as needed for wheezing or shortness of breath (cough, shortness of breath or  wheezing.). 05/15/22   Theadora Rama Scales, PA-C  cetirizine (ZYRTEC) 10 MG tablet Take 1 tablet (10 mg total) by mouth daily. 05/15/22 11/11/22  Theadora Rama Scales, PA-C  desonide (DESOWEN) 0.05 % cream Apply topically 2 (two) times daily. Use sparingly to the affected area 07/28/20   Cathie Hoops, Amy V, PA-C  EPINEPHrine 0.3 mg/0.3 mL IJ SOAJ injection Inject 0.3 mg into the muscle. 12/03/15   [provider]  fluticasone (FLONASE) 50 MCG/ACT nasal spray Place 1 spray into both nostrils daily. Begin by using 2 sprays in each nare daily for 3 to 5 days, then decrease to 1 spray in each nare daily. 05/15/22   Theadora Rama Scales, PA-C  methylPREDNISolone (MEDROL DOSEPAK) 4 MG TBPK tablet Take 24 mg on day 1, 20 mg on day 2, 16 mg on day 3, 12 mg on day 4, 8 mg on day 5, 4 mg on day 6.  Take all tablets in each row at once, do not spread tablets out throughout the day. 05/15/22   Theadora Rama Scales, PA-C    Family History Family History  Problem Relation Age of Onset   Irritable bowel syndrome Mother    Cholelithiasis Neg Hx    Ulcers Neg Hx     Social History Social History   Tobacco Use   Smoking status: Never    Passive exposure: Never  Smokeless tobacco: Never  Vaping Use   Vaping Use: Never used  Substance Use Topics   Alcohol use: No   Drug use: No     Allergies   Peanut-containing drug products and Egg [eggs or egg-derived products]   Review of Systems Review of Systems Per HPI  Physical Exam Triage Vital Signs ED Triage Vitals  Enc Vitals Group     BP 07/23/22 1028 (!) 98/59     Pulse Rate 07/23/22 1028 (!) 102     Resp 07/23/22 1028 18     Temp 07/23/22 1028 100 F (37.8 C)     Temp src --      SpO2 07/23/22 1028 94 %     Weight --      Height --      Head Circumference --      Peak Flow --      Pain Score 07/23/22 1027 7     Pain Loc --      Pain Edu? --      Excl. in GC? --    No data found.  Updated Vital Signs BP (!) 98/59   Pulse (!)  102   Temp 100 F (37.8 C)   Resp 18   SpO2 94%   Visual Acuity Right Eye Distance:   Left Eye Distance:   Bilateral Distance:    Right Eye Near:   Left Eye Near:    Bilateral Near:     Physical Exam Constitutional:      General: He is not in acute distress.    Appearance: Normal appearance. He is not toxic-appearing or diaphoretic.  HENT:     Head: Normocephalic and atraumatic.     Right Ear: Tympanic membrane and ear canal normal.     Left Ear: Tympanic membrane and ear canal normal.     Nose: Congestion present.     Mouth/Throat:     Mouth: Mucous membranes are moist.     Pharynx: No posterior oropharyngeal erythema.  Eyes:     Extraocular Movements: Extraocular movements intact.     Conjunctiva/sclera: Conjunctivae normal.     Pupils: Pupils are equal, round, and reactive to light.  Cardiovascular:     Rate and Rhythm: Normal rate and regular rhythm.     Pulses: Normal pulses.     Heart sounds: Normal heart sounds.  Pulmonary:     Effort: Pulmonary effort is normal. No respiratory distress.     Breath sounds: Normal breath sounds. No stridor. No wheezing, rhonchi or rales.  Abdominal:     General: Abdomen is flat. Bowel sounds are normal.     Palpations: Abdomen is soft.  Musculoskeletal:        General: Normal range of motion.     Cervical back: Normal range of motion.       Back:     Comments: Tenderness to palpation to right thoracic back.  No direct spinal tenderness, crepitus, step-off.  No obvious swelling, discoloration, lacerations, abrasions noted.  Grip strength 5/5.  Skin:    General: Skin is warm and dry.  Neurological:     General: No focal deficit present.     Mental Status: He is alert and oriented to person, place, and time. Mental status is at baseline.  Psychiatric:        Mood and Affect: Mood normal.        Behavior: Behavior normal.      UC Treatments / Results  Labs (all labs ordered  are listed, but only abnormal results are  displayed) Labs Reviewed  RESP PANEL BY RT-PCR (FLU A&B, COVID) [id number]    EKG   Radiology No results found.  Procedures Procedures (including critical care time)  Medications Ordered in UC Medications - No data to display  Initial Impression / Assessment and Plan / UC Course  I have reviewed the triage vital signs and the nursing notes.  Pertinent labs & imaging results that were available during my care of the patient were reviewed by me and considered in my medical decision making (see chart for details).     Patient presents with symptoms likely from a viral upper respiratory infection. Differential includes bacterial pneumonia, sinusitis, allergic rhinitis, COVID-19, flu. Do not suspect underlying cardiopulmonary process. Symptoms seem unlikely related to ACS, CHF or COPD exacerbations, pneumonia, pneumothorax. Patient is nontoxic appearing and not in need of emergent medical intervention.  Suspicious for COVID-19 given patient's close exposure.  COVID test pending.  Suspect mild tachycardia is related to fever. Patient recently took Tylenol about 30 minutes to hour prior to arrival.  Recommended symptom control with over the counter medications.  Fever monitoring and management discussed with patient.  Right upper back pain is related to muscle strain versus generalized body aches.  Advised supportive care and alternating ice and heat to affected area.  Return if symptoms fail to improve in 1-2 weeks or you develop shortness of breath, chest pain, severe headache. Patient states understanding and is agreeable.  Discharged with PCP followup.  Final Clinical Impressions(s) / UC Diagnoses   Final diagnoses:  Viral upper respiratory infection  Close exposure to COVID-19 virus  Acute right-sided thoracic back pain     Discharge Instructions      I am suspicious that you have COVID-19.  COVID test is pending.  We will call if it is positive.  You do have a viral illness  that is causing your symptoms which should run the course and self resolve with symptomatic treatment as we discussed.  Your back pain appears to be a muscle strain versus body aches.      ED Prescriptions   None    PDMP not reviewed this encounter.   Teodora Medici, [place] 07/23/22 1130

## 2022-07-23 NOTE — ED Triage Notes (Signed)
Pt is present today with c/o HA, sore throat, chills, nasal congestion and cough. Pt sx started x3 days ago    Pt states that he also has pain down spine. Pt sx started this morning. Pt denies any injury   Pt states that he took tylenol 30 mins ago

## 2022-09-30 ENCOUNTER — Ambulatory Visit
Admission: RE | Admit: 2022-09-30 | Discharge: 2022-09-30 | Disposition: A | Discharge status: Home / Self Care | Payer: 59 | Source: Ambulatory Visit | Attending: Urgent Care | Admitting: Urgent Care

## 2022-09-30 VITALS — BP 132/76 | HR 101 | Temp 99.1°F | Resp 20

## 2022-09-30 DIAGNOSIS — J309 Allergic rhinitis, unspecified: Secondary | ICD-10-CM | POA: Diagnosis not present

## 2022-09-30 DIAGNOSIS — J101 Influenza due to other identified influenza virus with other respiratory manifestations: Secondary | ICD-10-CM | POA: Diagnosis not present

## 2022-09-30 DIAGNOSIS — R051 Acute cough: Secondary | ICD-10-CM | POA: Diagnosis present

## 2022-09-30 DIAGNOSIS — R070 Pain in throat: Secondary | ICD-10-CM

## 2022-09-30 DIAGNOSIS — B349 Viral infection, unspecified: Secondary | ICD-10-CM

## 2022-09-30 DIAGNOSIS — J4520 Mild intermittent asthma, uncomplicated: Secondary | ICD-10-CM | POA: Diagnosis not present

## 2022-09-30 DIAGNOSIS — Z79899 Other long term (current) drug therapy: Secondary | ICD-10-CM | POA: Insufficient documentation

## 2022-09-30 DIAGNOSIS — Z7952 Long term (current) use of systemic steroids: Secondary | ICD-10-CM | POA: Diagnosis not present

## 2022-09-30 DIAGNOSIS — Z1152 Encounter for screening for COVID-19: Secondary | ICD-10-CM | POA: Diagnosis not present

## 2022-09-30 LAB — POCT RAPID STREP A (OFFICE): Rapid Strep A Screen: NEGATIVE

## 2022-09-30 LAB — RESP PANEL BY RT-PCR (FLU A&B, COVID) ARPGX2
Influenza A by PCR: POSITIVE — AB
Influenza B by PCR: NEGATIVE
SARS Coronavirus 2 by RT PCR: NEGATIVE

## 2022-09-30 MED ORDER — PROMETHAZINE-DM 6.25-15 MG/5ML PO SYRP: 5.0000 mL | ORAL_SOLUTION | Freq: Three times a day (TID) | ORAL | 0 refills | Status: DC | PRN

## 2022-09-30 MED ORDER — PREDNISONE 20 MG PO TABS: ORAL_TABLET | ORAL | 0 refills | Status: DC

## 2022-09-30 NOTE — ED Triage Notes (Signed)
Pt c/o body aches, cough, sore throat started last night-NAD-steady gait

## 2022-09-30 NOTE — Discharge Instructions (Signed)
We will notify you of your test results as they arrive and may take between about 24 hours.  I encourage you to sign up for MyChart if you have not already done so as this can be the easiest way for us to communicate results to you online or through a phone app.  Generally, we only contact you if it is a positive test result.  In the meantime, if you develop worsening symptoms including fever, chest pain, shortness of breath despite our current treatment plan then please report to the emergency room as this may be a sign of worsening status from possible viral infection.  Otherwise, we will manage this as a viral syndrome. For sore throat or cough try using a honey-based tea. Use 3 teaspoons of honey with juice squeezed from half lemon. Place shaved pieces of ginger into 1/2-1 cup of water and warm over stove top. Then mix the ingredients and repeat every 4 hours as needed. Please take Tylenol 500mg-650mg every 6 hours for aches and pains, fevers. Hydrate very well with at least 2 liters of water. Eat light meals such as soups to replenish electrolytes and soft fruits, veggies. Start an antihistamine like Zyrtec for postnasal drainage, sinus congestion.  You can take this together with prednisone.  Use the cough medications as needed.   

## 2022-09-30 NOTE — ED Provider Notes (Signed)
[facility]  Note:  This document was prepared using Conservation officer, historic buildings and may include unintentional dictation errors.  MRN: [id number] DOB: [birth year]  Subjective:   Chad Simmons is a 26 y.o. male presenting for acute onset severe body pains, body aches, subjective fever, throat pain, coughing since last night.  Patient would like to be checked for strep, COVID and flu.  Has a history of asthma but has not needed to use an inhaler recently.  Also has a history of allergic rhinitis and takes Zyrtec regularly for this.  No overt chest pain, wheezing, shortness of breath.  Patient is not opposed to taking either Tamiflu or Paxlovid should he test positive for COVID-19.  He is not a smoker, no vaping, drug use.  Last GFR from August of this year showed it was 60 mL/min.   No current facility-administered medications for this encounter.  Current Outpatient Medications:    albuterol (VENTOLIN HFA) 108 (90 Base) MCG/ACT inhaler, Inhale 2 puffs into the lungs every 4 (four) hours as needed for wheezing or shortness of breath (cough, shortness of breath or wheezing.)., Disp: 18 g, Rfl: 1   cetirizine (ZYRTEC) 10 MG tablet, Take 1 tablet (10 mg total) by mouth daily., Disp: 90 tablet, Rfl: 1   desonide (DESOWEN) 0.05 % cream, Apply topically 2 (two) times daily. Use sparingly to the affected area, Disp: 30 g, Rfl: 0   EPINEPHrine 0.3 mg/0.3 mL IJ SOAJ injection, Inject 0.3 mg into the muscle., Disp: , Rfl:    fluticasone (FLONASE) 50 MCG/ACT nasal spray, Place 1 spray into both nostrils daily. Begin by using 2 sprays in each nare daily for 3 to 5 days, then decrease to 1 spray in each nare daily., Disp: 15.8 mL, Rfl: 2   methylPREDNISolone (MEDROL DOSEPAK) 4 MG TBPK tablet, Take 24 mg on day 1, 20 mg on day 2, 16 mg on day 3, 12 mg on day 4, 8 mg on day 5, 4 mg on day 6.  Take all tablets in each row at once, do not spread tablets out throughout the day., Disp: 21  tablet, Rfl: 0   Allergies  Allergen Reactions   Peanut-Containing Drug Products Anaphylaxis    Past Medical History:  Diagnosis Date   Abdominal pain    Asthma    Constipation    Decreased appetite    Eczema    Nausea    Weight loss      Past Surgical History:  Procedure Laterality Date   ORCHIOPEXY Bilateral 04/07/2022   Procedure: ORCHIOPEXY ADULT;  Surgeon: Jerilee Field, MD;  Location: [facility];  Service: Urology;  Laterality: Bilateral;    Family History  Problem Relation Age of Onset   Irritable bowel syndrome Mother    Cholelithiasis Neg Hx    Ulcers Neg Hx     Social History   Tobacco Use   Smoking status: Never    Passive exposure: Never   Smokeless tobacco: Never  Vaping Use   Vaping Use: Never used  Substance Use Topics   Alcohol use: No   Drug use: No    ROS   Objective:   Vitals: BP 132/76 (BP Location: Right Arm)   Pulse (!) 101   Temp 99.1 F (37.3 C) (Oral)   Resp 20   SpO2 96%   Physical Exam Constitutional:      General: He is not in acute distress.    Appearance: Normal appearance. He is well-developed and  normal weight. He is not ill-appearing, toxic-appearing or diaphoretic.  HENT:     Head: Normocephalic and atraumatic.     Right Ear: Tympanic membrane, ear canal and external ear normal. No drainage, swelling or tenderness. No middle ear effusion. There is no impacted cerumen. Tympanic membrane is not erythematous or bulging.     Left Ear: Tympanic membrane, ear canal and external ear normal. No drainage, swelling or tenderness.  No middle ear effusion. There is no impacted cerumen. Tympanic membrane is not erythematous or bulging.     Nose: Congestion present. No rhinorrhea.     Mouth/Throat:     Mouth: Mucous membranes are moist.     Pharynx: No oropharyngeal exudate or posterior oropharyngeal erythema.     Comments: Post-nasal drainage overlying pharynx.  Eyes:     General: No scleral icterus.       Right eye: No  discharge.        Left eye: No discharge.     Extraocular Movements: Extraocular movements intact.     Conjunctiva/sclera: Conjunctivae normal.  Cardiovascular:     Rate and Rhythm: Normal rate and regular rhythm.     Heart sounds: Normal heart sounds. No murmur heard.    No friction rub. No gallop.  Pulmonary:     Effort: Pulmonary effort is normal. No respiratory distress.     Breath sounds: Normal breath sounds. No stridor. No wheezing, rhonchi or rales.  Musculoskeletal:     Cervical back: Normal range of motion and neck supple. No rigidity. No muscular tenderness.  Neurological:     General: No focal deficit present.     Mental Status: He is alert and oriented to person, place, and time.  Psychiatric:        Mood and Affect: Mood normal.        Behavior: Behavior normal.        Thought Content: Thought content normal.     Results for orders placed or performed during the hospital encounter of 09/30/22 (from the past 24 hour(s))  POCT rapid strep A     Status: None   Collection Time: 09/30/22  3:08 PM  Result Value Ref Range   Rapid Strep A Screen Negative Negative    Assessment and Plan :   PDMP not reviewed this encounter.  1. Acute viral syndrome   2. Throat pain   3. Acute cough   4. Mild intermittent asthma without complication   5. Allergic rhinitis, unspecified seasonality, unspecified trigger     Deferred imaging given clear cardiopulmonary exam, hemodynamically stable vital signs.  Strep culture, COVID and flu test are pending.  I held off on empiric treatment given the timeline of his illness.  Patient was somewhat hesitant to start empiric treatment as well and therefore we will base this off of his results which should be available tomorrow.  In the context of his respiratory symptoms, asthma and allergic rhinitis recommended a oral prednisone course.  Use supportive care otherwise for an acute viral syndrome. Counseled patient on potential for adverse  effects with medications prescribed/recommended today, ER and return-to-clinic precautions discussed, patient verbalized understanding.    Wallis Bamberg, PA-C 09/30/22 1537

## 2022-10-01 ENCOUNTER — Telehealth (HOSPITAL_COMMUNITY): Payer: Self-pay | Admitting: Emergency Medicine

## 2022-10-01 MED ORDER — OSELTAMIVIR PHOSPHATE 75 MG PO CAPS: 75.0000 mg | ORAL_CAPSULE | Freq: Two times a day (BID) | ORAL | 0 refills | Status: DC

## 2022-10-03 LAB — CULTURE, GROUP A STREP (THRC)

## 2022-11-10 ENCOUNTER — Ambulatory Visit
Admission: EM | Admit: 2022-11-10 | Discharge: 2022-11-10 | Disposition: A | Discharge status: Home / Self Care | Payer: 59 | Attending: Urgent Care | Admitting: Urgent Care

## 2022-11-10 DIAGNOSIS — R062 Wheezing: Secondary | ICD-10-CM | POA: Diagnosis not present

## 2022-11-10 DIAGNOSIS — T781XXA Other adverse food reactions, not elsewhere classified, initial encounter: Secondary | ICD-10-CM

## 2022-11-10 DIAGNOSIS — L500 Allergic urticaria: Secondary | ICD-10-CM

## 2022-11-10 DIAGNOSIS — T7840XA Allergy, unspecified, initial encounter: Secondary | ICD-10-CM | POA: Diagnosis not present

## 2022-11-10 MED ORDER — HYDROXYZINE HCL 25 MG PO TABS: 12.5000 mg | ORAL_TABLET | Freq: Three times a day (TID) | ORAL | 0 refills | Status: DC | PRN

## 2022-11-10 MED ORDER — EPINEPHRINE 0.3 MG/0.3ML IJ SOAJ: 0.3000 mg | INTRAMUSCULAR | 0 refills | Status: AC | PRN

## 2022-11-10 MED ORDER — PREDNISONE 20 MG PO TABS: ORAL_TABLET | ORAL | 0 refills | Status: DC

## 2022-11-10 NOTE — ED Provider Notes (Signed)
[facility]  Note:  This document was prepared using Systems analyst and may include unintentional dictation errors.  MRN: [id number] DOB: February 11, 1996  Subjective:   Chad Simmons is a 27 y.o. male presenting for an evaluation for a reaction to peanuts.  Has a known allergy to them.  Had exposure last night.  Around 2 AM, started having wheezing, shortness of breath, swelling to the face including the eyebrows, eyelids in the cheek area.  He also developed hives.  Does not have an EpiPen and would like a prescription for this.  He took Benadryl and had leftover prednisone which started slowly improving his symptoms.  He ended up going to bed and today reports that he feels better but still has swelling over his forehead.  No current facility-administered medications for this encounter.  Current Outpatient Medications:    albuterol (VENTOLIN HFA) 108 (90 Base) MCG/ACT inhaler, Inhale 2 puffs into the lungs every 4 (four) hours as needed for wheezing or shortness of breath (cough, shortness of breath or wheezing.)., Disp: 18 g, Rfl: 1   cetirizine (ZYRTEC) 10 MG tablet, Take 1 tablet (10 mg total) by mouth daily., Disp: 90 tablet, Rfl: 1   desonide (DESOWEN) 0.05 % cream, Apply topically 2 (two) times daily. Use sparingly to the affected area, Disp: 30 g, Rfl: 0   EPINEPHrine 0.3 mg/0.3 mL IJ SOAJ injection, Inject 0.3 mg into the muscle., Disp: , Rfl:    fluticasone (FLONASE) 50 MCG/ACT nasal spray, Place 1 spray into both nostrils daily. Begin by using 2 sprays in each nare daily for 3 to 5 days, then decrease to 1 spray in each nare daily., Disp: 15.8 mL, Rfl: 2   methylPREDNISolone (MEDROL DOSEPAK) 4 MG TBPK tablet, Take 24 mg on day 1, 20 mg on day 2, 16 mg on day 3, 12 mg on day 4, 8 mg on day 5, 4 mg on day 6.  Take all tablets in each row at once, do not spread tablets out throughout the day., Disp: 21 tablet, Rfl: 0   oseltamivir (TAMIFLU) 75 MG  capsule, Take 1 capsule (75 mg total) by mouth every 12 (twelve) hours., Disp: 10 capsule, Rfl: 0   predniSONE (DELTASONE) 20 MG tablet, Take 2 tablets daily with breakfast., Disp: 10 tablet, Rfl: 0   promethazine-dextromethorphan (PROMETHAZINE-DM) 6.25-15 MG/5ML syrup, Take 5 mLs by mouth 3 (three) times daily as needed for cough., Disp: 200 mL, Rfl: 0   Allergies  Allergen Reactions   Peanut-Containing Drug Products Anaphylaxis    Past Medical History:  Diagnosis Date   Abdominal pain    Asthma    Constipation    Decreased appetite    Eczema    Nausea    Weight loss      Past Surgical History:  Procedure Laterality Date   ORCHIOPEXY Bilateral 04/07/2022   Procedure: ORCHIOPEXY ADULT;  Surgeon: Festus Aloe, MD;  Location: [facility];  Service: Urology;  Laterality: Bilateral;    Family History  Problem Relation Age of Onset   Irritable bowel syndrome Mother    Cholelithiasis Neg Hx    Ulcers Neg Hx     Social History   Tobacco Use   Smoking status: Never    Passive exposure: Never   Smokeless tobacco: Never  Vaping Use   Vaping Use: Never used  Substance Use Topics   Alcohol use: No   Drug use: No    ROS   Objective:  Vitals: BP (!) 149/74 (BP Location: Right Arm)   Pulse 89   Temp 98.8 F (37.1 C) (Oral)   Resp 18   SpO2 96%   Physical Exam Constitutional:      General: He is not in acute distress.    Appearance: Normal appearance. He is well-developed. He is not ill-appearing, toxic-appearing or diaphoretic.  HENT:     Head: Normocephalic and atraumatic.     Comments: Trace swelling of the forehead.    Right Ear: External ear normal.     Left Ear: External ear normal.     Ears:     Comments: No swelling of the ears.    Nose: Nose normal.     Mouth/Throat:     Mouth: Mucous membranes are moist.     Pharynx: No oropharyngeal exudate or posterior oropharyngeal erythema.     Comments: Airway is patent, patient is controlling secretions and  speaking in full sentences. Eyes:     General: No scleral icterus.       Right eye: No discharge.        Left eye: No discharge.     Extraocular Movements: Extraocular movements intact.     Conjunctiva/sclera: Conjunctivae normal.     Comments: No swelling of the eyelids.  Cardiovascular:     Rate and Rhythm: Normal rate and regular rhythm.     Heart sounds: Normal heart sounds. No murmur heard.    No friction rub. No gallop.  Pulmonary:     Effort: Pulmonary effort is normal. No respiratory distress.     Breath sounds: Normal breath sounds. No stridor. No wheezing, rhonchi or rales.  Neurological:     Mental Status: He is alert and oriented to person, place, and time.  Psychiatric:        Mood and Affect: Mood normal.        Behavior: Behavior normal.        Thought Content: Thought content normal.     Assessment and Plan :   PDMP not reviewed this encounter.  1. Allergic urticaria   2. Allergic reaction, initial encounter   3. Allergic reaction to peanut   4. Wheezing     Recommended following through with a full prednisone course.  Use hydroxyzine as needed for itching.  I a prescription for 2 epinephrine pens.  At this point I do not suspect anaphylaxis given his overall appearance and progression. Counseled patient on potential for adverse effects with medications prescribed/recommended today, ER and return-to-clinic precautions discussed, patient verbalized understanding.    Jaynee Eagles, [place] 11/10/22 [phone]

## 2022-11-10 NOTE — ED Triage Notes (Signed)
Pt c/o hives, wheezing, swelling to face started ~2am-reaction to peanuts-states he has peanut allergy-took prednisone, benadryl-NAD-steady gait

## 2022-11-25 ENCOUNTER — Ambulatory Visit: Payer: 59 | Admitting: Family Medicine

## 2022-11-25 ENCOUNTER — Encounter: Payer: Self-pay | Admitting: Family Medicine

## 2022-11-25 VITALS — BP 118/72 | HR 72 | Temp 97.6°F | Ht 68.0 in | Wt 194.0 lb

## 2022-11-25 DIAGNOSIS — J3089 Other allergic rhinitis: Secondary | ICD-10-CM

## 2022-11-25 DIAGNOSIS — J4520 Mild intermittent asthma, uncomplicated: Secondary | ICD-10-CM | POA: Diagnosis not present

## 2022-11-25 DIAGNOSIS — J302 Other seasonal allergic rhinitis: Secondary | ICD-10-CM

## 2022-11-25 DIAGNOSIS — R7303 Prediabetes: Secondary | ICD-10-CM

## 2022-11-25 NOTE — Patient Instructions (Addendum)
Exercise for at least 150 minutes each week.   Cut back on carbohydrates (potatoes, bread, pasta, rice, and sugar)   Prediabetes Eating Plan Prediabetes is a condition that causes blood sugar (glucose) levels to be higher than normal. This increases the risk for developing type 2 diabetes (type 2 diabetes mellitus). Working with a health care provider or nutrition specialist (dietitian) to make diet and lifestyle changes can help prevent the onset of diabetes. These changes may help you: Control your blood glucose levels. Improve your cholesterol levels. Manage your blood pressure. What are tips for following this plan? Reading food labels Read food labels to check the amount of fat, salt (sodium), and sugar in prepackaged foods. Avoid foods that have: Saturated fats. Trans fats. Added sugars. Avoid foods that have more than 300 milligrams (mg) of sodium per serving. Limit your sodium intake to less than 2,300 mg each day. Shopping Avoid buying pre-made and processed foods. Avoid buying drinks with added sugar. Cooking Cook with olive oil. Do not use butter, lard, or ghee. Bake, broil, grill, steam, or boil foods. Avoid frying. Meal planning  Work with your dietitian to create an eating plan that is right for you. This may include tracking how many calories you take in each day. Use a food diary, notebook, or mobile application to track what you eat at each meal. Consider following a Mediterranean diet. This includes: Eating several servings of fresh fruits and vegetables each day. Eating fish at least twice a week. Eating one serving each day of whole grains, beans, nuts, and seeds. Using olive oil instead of other fats. Limiting alcohol. Limiting red meat. Using nonfat or low-fat dairy products. Consider following a plant-based diet. This includes dietary choices that focus on eating mostly vegetables and fruit, grains, beans, nuts, and seeds. If you have high blood pressure, you  may need to limit your sodium intake or follow a diet such as the DASH (Dietary Approaches to Stop Hypertension) eating plan. The DASH diet aims to lower high blood pressure. Lifestyle Set weight loss goals with help from your health care team. It is recommended that most people with prediabetes lose 7% of their body weight. Exercise for at least 30 minutes 5 or more days a week. Attend a support group or seek support from a mental health counselor. Take over-the-counter and prescription medicines only as told by your health care provider. What foods are recommended? Fruits Berries. Bananas. Apples. Oranges. Grapes. Papaya. Mango. Pomegranate. Kiwi. Grapefruit. Cherries. Vegetables Lettuce. Spinach. Peas. Beets. Cauliflower. Cabbage. Broccoli. Carrots. Tomatoes. Squash. Eggplant. Herbs. Peppers. Onions. Cucumbers. Brussels sprouts. Grains Whole grains, such as whole-wheat or whole-grain breads, crackers, cereals, and pasta. Unsweetened oatmeal. Bulgur. Barley. Quinoa. Brown rice. Corn or whole-wheat flour tortillas or taco shells. Meats and other proteins Seafood. Poultry without skin. Lean cuts of pork and beef. Tofu. Eggs. Nuts. Beans. Dairy Low-fat or fat-free dairy products, such as yogurt, cottage cheese, and cheese. Beverages Water. Tea. Coffee. Sugar-free or diet soda. Seltzer water. Low-fat or nonfat milk. Milk alternatives, such as soy or almond milk. Fats and oils Olive oil. Canola oil. Sunflower oil. Grapeseed oil. Avocado. Walnuts. Sweets and desserts Sugar-free or low-fat pudding. Sugar-free or low-fat ice cream and other frozen treats. Seasonings and condiments Herbs. Sodium-free spices. Mustard. Relish. Low-salt, low-sugar ketchup. Low-salt, low-sugar barbecue sauce. Low-fat or fat-free mayonnaise. The items listed above may not be a complete list of recommended foods and beverages. Contact a dietitian for more information. What foods are not recommended?  Fruits Fruits  canned with syrup. Vegetables Canned vegetables. Frozen vegetables with butter or cream sauce. Grains Refined white flour and flour products, such as bread, pasta, snack foods, and cereals. Meats and other proteins Fatty cuts of meat. Poultry with skin. Breaded or fried meat. Processed meats. Dairy Full-fat yogurt, cheese, or milk. Beverages Sweetened drinks, such as iced tea and soda. Fats and oils Butter. Lard. Ghee. Sweets and desserts Baked goods, such as cake, cupcakes, pastries, cookies, and cheesecake. Seasonings and condiments Spice mixes with added salt. Ketchup. Barbecue sauce. Mayonnaise. The items listed above may not be a complete list of foods and beverages that are not recommended. Contact a dietitian for more information. Where to find more information American Diabetes Association: www.diabetes.org Summary You may need to make diet and lifestyle changes to help prevent the onset of diabetes. These changes can help you control blood sugar, improve cholesterol levels, and manage blood pressure. Set weight loss goals with help from your health care team. It is recommended that most people with prediabetes lose 7% of their body weight. Consider following a Mediterranean diet. This includes eating plenty of fresh fruits and vegetables, whole grains, beans, nuts, seeds, fish, and low-fat dairy, and using olive oil instead of other fats. This information is not intended to replace advice given to you by your health care provider. Make sure you discuss any questions you have with your health care provider. Document Revised: 01/17/2020 Document Reviewed: 01/17/2020 Elsevier Patient Education  [place].

## 2022-11-25 NOTE — Progress Notes (Signed)
New Patient Office Visit  Subjective    Patient ID: Chad Simmons, male    DOB: 07-13-1996  Age: 27 y.o. MRN: [id number]  CC:  Chief Complaint  Patient presents with   Establish Care    HPI Chad Simmons presents to establish care Previous medical care- no PCP  Asthma- since childhood. Not bothersome. Mainly flares up with colds and illnesses.   Allergies- takes medications prn but is recommended year round  Prediabetes- A1c 5.9% Diet fairly high in carbohydrates. Has not been exercising.  Mother has diabetes.   Hx of herniated disc. Takes diclofenac and Zanaflex prn  Denies fever, chills, dizziness, chest pain, palpitations, shortness of breath, abdominal pain, N/V/D, urinary symptoms, LE edema.    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   cetirizine (ZYRTEC) 10 MG tablet Take 1 tablet (10 mg total) by mouth daily.   diclofenac (VOLTAREN) 75 MG EC tablet Take 75 mg by mouth 2 (two) times daily as needed.   EPINEPHrine 0.3 mg/0.3 mL IJ SOAJ injection Inject 0.3 mg into the muscle as needed for anaphylaxis. (Patient not taking: Reported on 11/25/2022)   fluticasone (FLONASE) 50 MCG/ACT nasal spray Place 1 spray into both nostrils daily. Begin by using 2 sprays in each nare daily for 3 to 5 days, then decrease to 1 spray in each nare daily. (Patient not taking: Reported on 11/25/2022)   [DISCONTINUED] desonide (DESOWEN) 0.05 % cream Apply topically 2 (two) times daily. Use sparingly to the affected area (Patient not taking: Reported on 11/25/2022)   [DISCONTINUED] hydrOXYzine (ATARAX) 25 MG tablet Take 0.5-1 tablets (12.5-25 mg total) by mouth every 8 (eight) hours as needed for itching. (Patient not taking: Reported on 11/25/2022)    [DISCONTINUED] oseltamivir (TAMIFLU) 75 MG capsule Take 1 capsule (75 mg total) by mouth every 12 (twelve) hours. (Patient not taking: Reported on 11/25/2022)   [DISCONTINUED] predniSONE (DELTASONE) 20 MG tablet Take 2 tablets daily with breakfast. (Patient not taking: Reported on 11/25/2022)   [DISCONTINUED] promethazine-dextromethorphan (PROMETHAZINE-DM) 6.25-15 MG/5ML syrup Take 5 mLs by mouth 3 (three) times daily as needed for cough. (Patient not taking: Reported on 11/25/2022)   No facility-administered encounter medications on file as of 11/25/2022.    Past Medical History:  Diagnosis Date   Abdominal pain    Asthma    Constipation    Decreased appetite    Eczema    Nausea    Weight loss     Past Surgical History:  Procedure Laterality Date   ORCHIOPEXY Bilateral 04/07/2022   Procedure: ORCHIOPEXY ADULT;  Surgeon: Festus Aloe, MD;  Location: [facility];  Service: Urology;  Laterality: Bilateral;    Family History  Problem Relation Age of Onset   Diabetes Mother    Irritable bowel syndrome Mother    Cholelithiasis Neg Hx    Ulcers Neg Hx     Social History   Socioeconomic History   Marital status: Single    Spouse name: Not on file   Number of children: Not on file   Years of education: Not on file   Highest education level: Not on file  Occupational History   Not on file  Tobacco Use   Smoking status: Never    Passive exposure: Never  Smokeless tobacco: Never  Vaping Use   Vaping Use: Never used  Substance and Sexual Activity   Alcohol use: No   Drug use: No   Sexual activity: Yes  Other Topics Concern   Not on file  Social History Narrative   9th grade 2014-2015   Social Determinants of Health   Financial Resource Strain: Not on file  Food Insecurity: Not on file  Transportation Needs: Not on file  Physical Activity: Not on file  Stress: Not on file  Social Connections: Not on file  Intimate Partner Violence: Not on file    ROS       Objective    BP 118/72 (BP Location: Left Arm, Patient Position: Sitting, Cuff Size: Large)   Pulse 72   Temp 97.6 F (36.4 C) (Temporal)   Ht 5\' 8"  (1.727 m)   Wt 194 lb (88 kg)   SpO2 98%   BMI 29.50 kg/m   Physical Exam Constitutional:      General: He is not in acute distress.    Appearance: He is not ill-appearing.  Eyes:     Conjunctiva/sclera: Conjunctivae normal.  Cardiovascular:     Rate and Rhythm: Normal rate.  Pulmonary:     Effort: Pulmonary effort is normal.  Musculoskeletal:     Cervical back: Normal range of motion.  Skin:    General: Skin is warm and dry.  Neurological:     General: No focal deficit present.     Mental Status: He is alert and oriented to person, place, and time.  Psychiatric:        Mood and Affect: Mood normal.        Behavior: Behavior normal.        Thought Content: Thought content normal.         Assessment & Plan:   Problem List Items Addressed This Visit       Respiratory   Mild intermittent asthma without complication - Primary   Seasonal and perennial allergic rhinitis     Other   Prediabetes   He is a pleasant 27-year-old male who is new to the practice and here to establish care. Asthma well-controlled.  Uses albuterol infrequently.  Takes allergy medications as needed. Prediabetes-discussed eating a lower carbohydrate diet and cutting back on sugar.  Also discussed increasing exercise.  Handout provided regarding prediabetes His mood is good. Reviewed labs from August 2023 with patient. He will follow-up in 3 months for a CPE or sooner if needed.  Return in about 3 months (around 02/24/2023) for CPE.   Harland Dingwall, NP-C

## 2022-12-20 IMAGING — US US SCROTUM W/ DOPPLER COMPLETE
1 series · 13 of 25 positions shown · non-contrast
Comparison: None Available.

CLINICAL DATA: Right testicle pain which began at 3 a.m.

EXAM:
SCROTAL ULTRASOUND
DOPPLER ULTRASOUND OF THE TESTICLES
TECHNIQUE: Complete ultrasound examination of the testicles, epididymis, and
other scrotal structures was performed. Color and spectral Doppler
ultrasound were also utilized to evaluate blood flow to the
testicles.

[Series 1: us scrotum w/doppler · 13 of 62 slices shown]
[im 1/62]
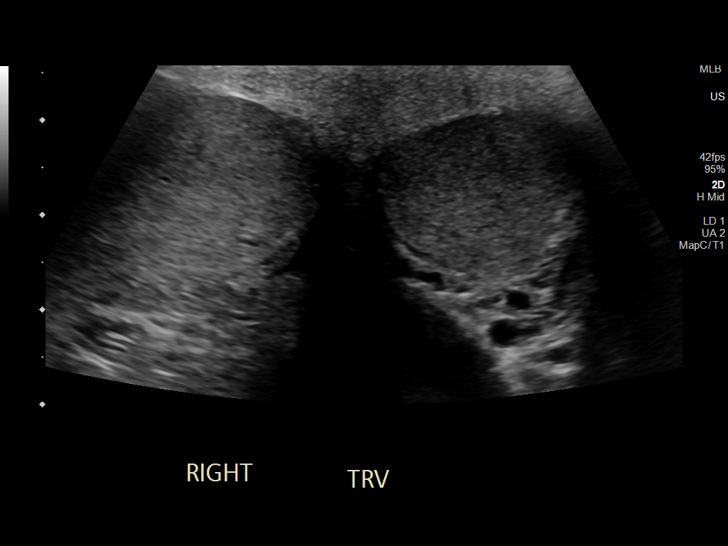
[im 6/62]
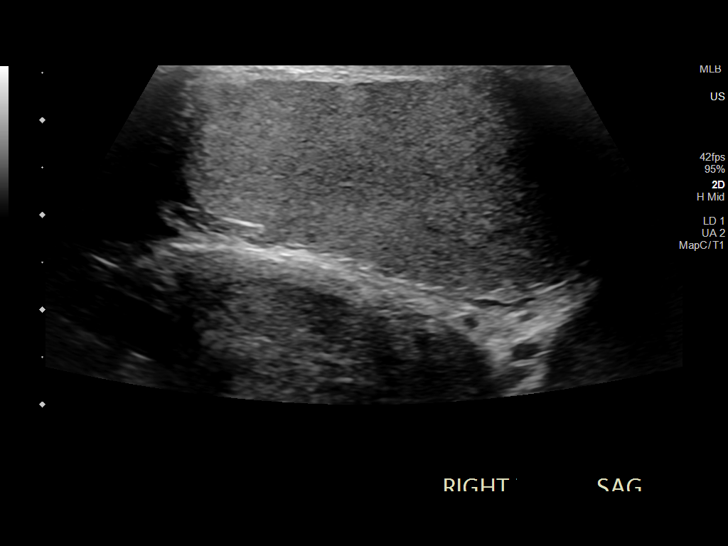
[im 11/62]
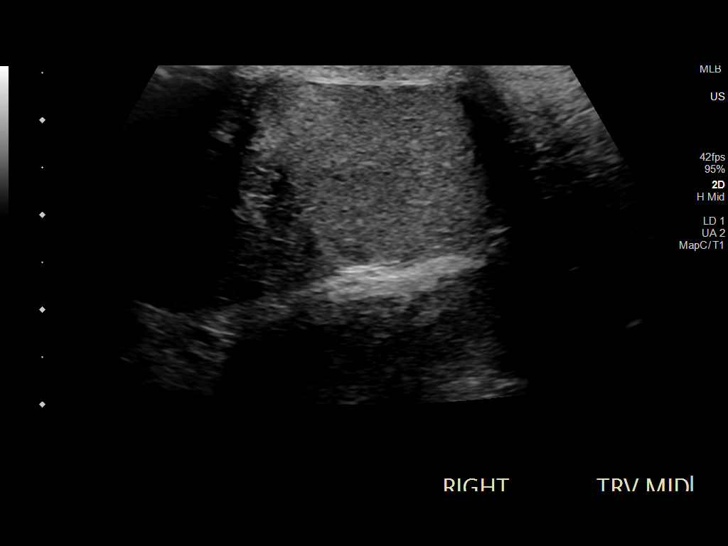
[im 16/62]
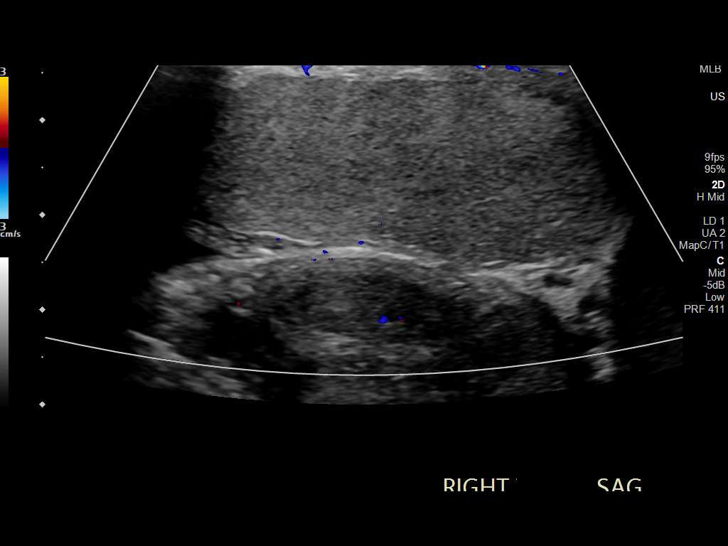
[im 21/62]
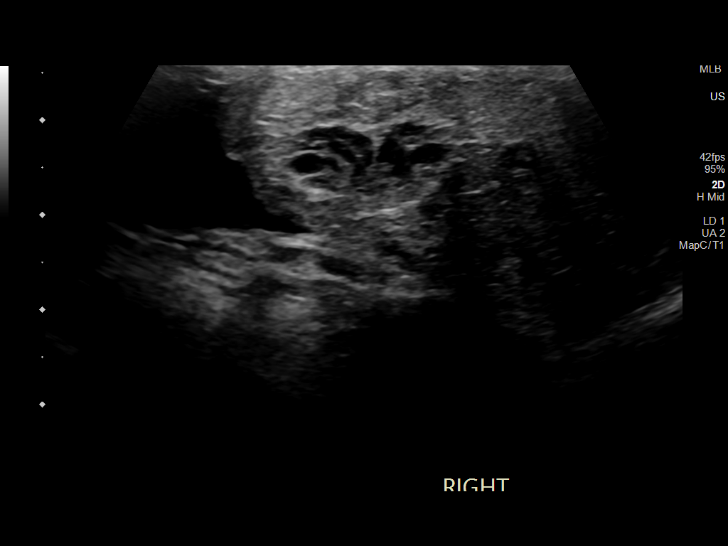
[im 26/62]
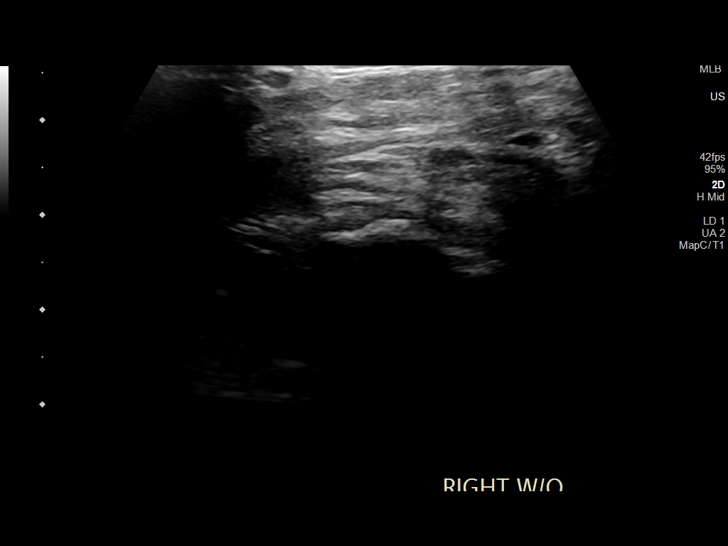
[im 31/62]
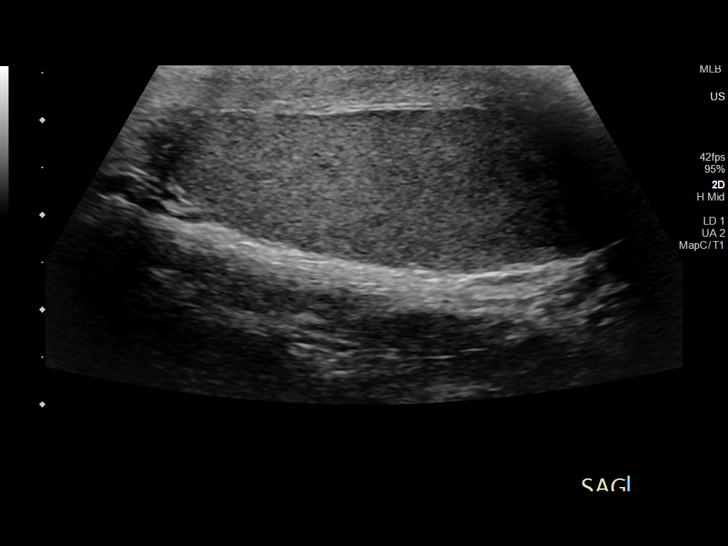
[im 36/62]
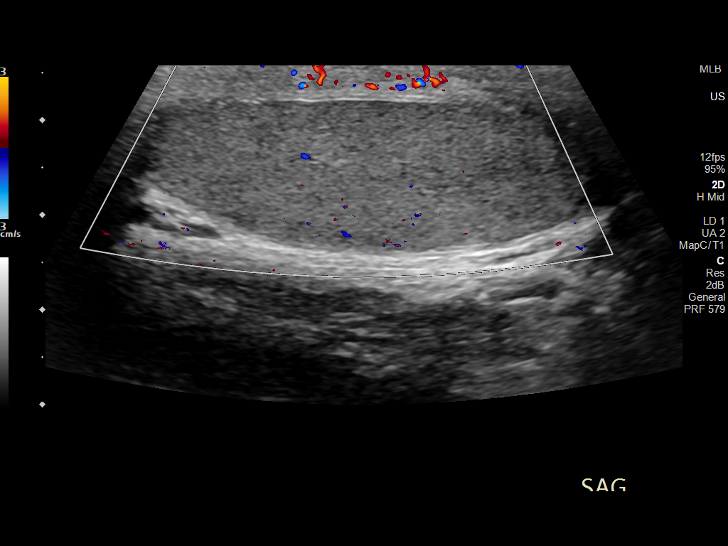
[im 41/62]
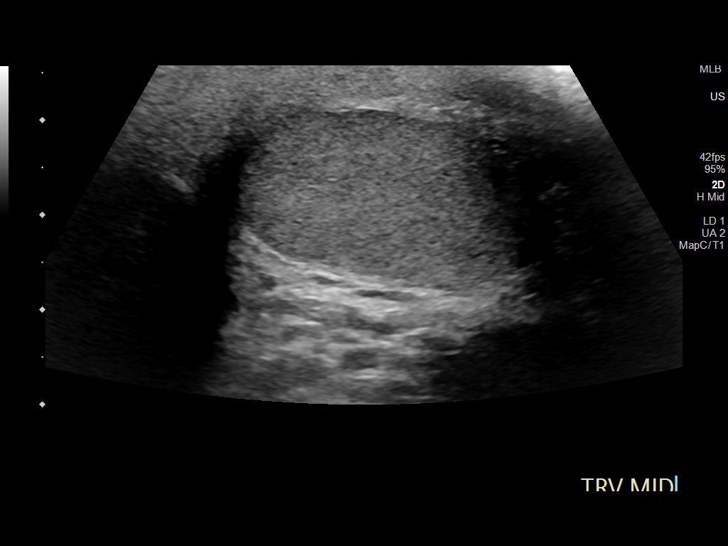
[im 46/62]
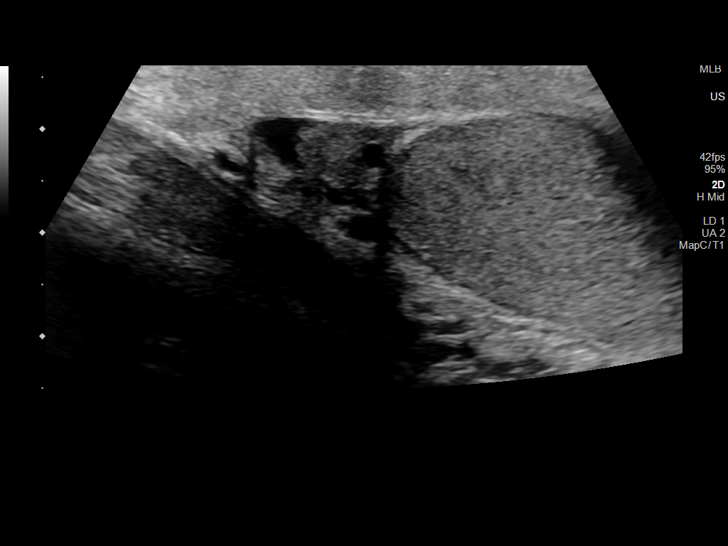
[im 51/62]
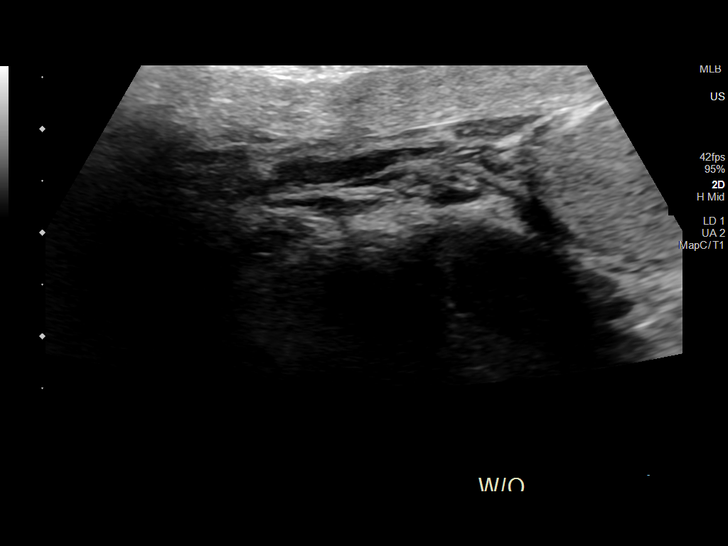
[im 56/62]
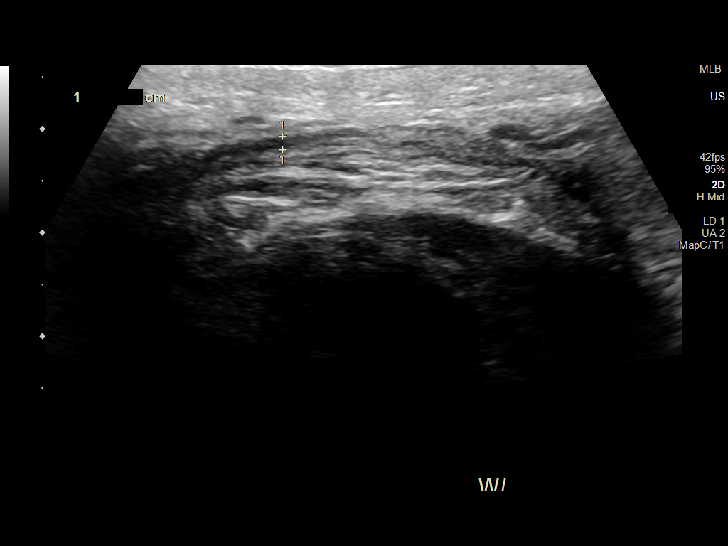
[im 62/62]
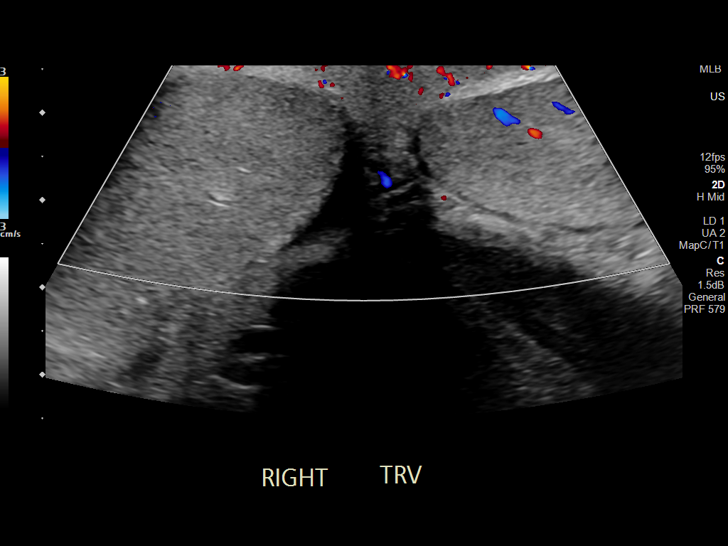

[13 of 25 positions shown; findings below may reference images not displayed]

FINDINGS: Right testicle

Measurements: 4.2 x 1.9 x 2.7 cm (volume = 11 cm^3). No mass or
microlithiasis visualized. Absent color Doppler flow.

Left testicle

Measurements: 4.7 x 1.5 x 3.2 cm (volume = 12 cm^3). No mass or
microlithiasis visualized. Normal color Doppler flow.

Right epididymis: The right epididymis appears enlarged with no
discernible blood flow on color Doppler.

Left epididymis:  Tiny cyst noted measuring 3 mm.

Hydrocele:  None visualized.

Varicocele:  None visualized.

Pulsed Doppler interrogation of both testes demonstrates normal low
resistance arterial and venous waveforms to the left testicle.
Absent arterial and venous waveforms identified to the right
testicle.
IMPRESSION: 1. Absence of color Doppler flow and arterial and venous waveforms
to the right testis and right epididymis. Findings are concerning
for testicular torsion.
2. Critical Value/emergent results were called by telephone at the
time of interpretation on 04/07/2022 at [DATE] to provider
KELAME OBEENG , who verbally acknowledged these results.

## 2023-02-02 ENCOUNTER — Encounter: Payer: Self-pay | Admitting: Family Medicine

## 2023-02-02 ENCOUNTER — Ambulatory Visit (INDEPENDENT_AMBULATORY_CARE_PROVIDER_SITE_OTHER): Payer: 59 | Admitting: Family Medicine

## 2023-02-02 VITALS — BP 120/76 | HR 70 | Temp 97.6°F | Ht 68.0 in | Wt 204.0 lb

## 2023-02-02 DIAGNOSIS — Z0001 Encounter for general adult medical examination with abnormal findings: Secondary | ICD-10-CM

## 2023-02-02 DIAGNOSIS — M62838 Other muscle spasm: Secondary | ICD-10-CM | POA: Insufficient documentation

## 2023-02-02 DIAGNOSIS — Z13220 Encounter for screening for lipoid disorders: Secondary | ICD-10-CM

## 2023-02-02 DIAGNOSIS — R7303 Prediabetes: Secondary | ICD-10-CM

## 2023-02-02 DIAGNOSIS — Z113 Encounter for screening for infections with a predominantly sexual mode of transmission: Secondary | ICD-10-CM

## 2023-02-02 DIAGNOSIS — R253 Fasciculation: Secondary | ICD-10-CM

## 2023-02-02 LAB — COMPREHENSIVE METABOLIC PANEL
ALT: 73 U/L — ABNORMAL HIGH (ref 0–53)
AST: 47 U/L — ABNORMAL HIGH (ref 0–37)
Albumin: 4.6 g/dL (ref 3.5–5.2)
Alkaline Phosphatase: 58 U/L (ref 39–117)
BUN: 19 mg/dL (ref 6–23)
CO2: 29 mEq/L (ref 19–32)
Calcium: 9.4 mg/dL (ref 8.4–10.5)
Chloride: 102 mEq/L (ref 96–112)
Creatinine, Ser: 1.18 mg/dL (ref 0.40–1.50)
GFR: 86.3 mL/min (ref >60.00)
Glucose, Bld: 94 mg/dL (ref 70–99)
Potassium: 4.5 mEq/L (ref 3.5–5.1)
Sodium: 137 mEq/L (ref 135–145)
Total Bilirubin: 0.3 mg/dL (ref 0.2–1.2)
Total Protein: 7.8 g/dL (ref 6.0–8.3)

## 2023-02-02 LAB — LIPID PANEL
Cholesterol: 166 mg/dL (ref 0–200)
HDL: 54.6 mg/dL (ref >39.00)
LDL Cholesterol: 98 mg/dL (ref 0–99)
NonHDL: 111.77
Total CHOL/HDL Ratio: 3
Triglycerides: 68 mg/dL (ref 0.0–149.0)
VLDL: 13.6 mg/dL (ref 0.0–40.0)

## 2023-02-02 LAB — CBC WITH DIFFERENTIAL/PLATELET
Basophils Absolute: 0.1 10*3/uL (ref 0.0–0.1)
Basophils Relative: 1.3 % (ref 0.0–3.0)
Eosinophils Absolute: 0.4 10*3/uL (ref 0.0–0.7)
Eosinophils Relative: 7.6 % — ABNORMAL HIGH (ref 0.0–5.0)
HCT: 44.9 % (ref 39.0–52.0)
Hemoglobin: 14.6 g/dL (ref 13.0–17.0)
Lymphocytes Relative: 45.8 % (ref 12.0–46.0)
Lymphs Abs: 2.4 10*3/uL (ref 0.7–4.0)
MCHC: 32.4 g/dL (ref 30.0–36.0)
MCV: 83.9 fl (ref 78.0–100.0)
Monocytes Absolute: 0.3 10*3/uL (ref 0.1–1.0)
Monocytes Relative: 6.6 % (ref 3.0–12.0)
Neutro Abs: 2 10*3/uL (ref 1.4–7.7)
Neutrophils Relative %: 38.7 % — ABNORMAL LOW (ref 43.0–77.0)
Platelets: 227 10*3/uL (ref 150.0–400.0)
RBC: 5.35 Mil/uL (ref 4.22–5.81)
RDW: 14.3 % (ref 11.5–15.5)
WBC: 5.1 10*3/uL (ref 4.0–10.5)

## 2023-02-02 LAB — URINALYSIS, ROUTINE W REFLEX MICROSCOPIC
Bilirubin Urine: NEGATIVE
Hgb urine dipstick: NEGATIVE
Ketones, ur: NEGATIVE
Leukocytes,Ua: NEGATIVE
Nitrite: NEGATIVE
RBC / HPF: NONE SEEN (ref 0–?)
Specific Gravity, Urine: 1.02 (ref 1.000–1.030)
Total Protein, Urine: NEGATIVE
Urine Glucose: NEGATIVE
Urobilinogen, UA: 0.2 (ref 0.0–1.0)
WBC, UA: NONE SEEN (ref 0–?)
pH: 6 (ref 5.0–8.0)

## 2023-02-02 LAB — T4, FREE: Free T4: 0.85 ng/dL (ref 0.60–1.60)

## 2023-02-02 LAB — MAGNESIUM: Magnesium: 2 mg/dL (ref 1.5–2.5)

## 2023-02-02 LAB — TSH: TSH: 1.92 u[IU]/mL (ref 0.35–5.50)

## 2023-02-02 LAB — HEMOGLOBIN A1C: Hgb A1c MFr Bld: 6.1 % (ref 4.6–6.5)

## 2023-02-02 LAB — VITAMIN B12: Vitamin B-12: 712 pg/mL (ref 211–911)

## 2023-02-02 NOTE — Assessment & Plan Note (Signed)
Check labs and follow up 

## 2023-02-02 NOTE — Assessment & Plan Note (Addendum)
Preventive health care reviewed.  Counseling on healthy lifestyle including diet and exercise.  Recommend regular dental and eye exams.  Immunizations reviewed.  Discussed safety.  

## 2023-02-02 NOTE — Patient Instructions (Addendum)
Call and schedule with a dentist.   Let us know if you have not had the HPV vaccines and you can return for a nurse visit to get these.    Muscle Cramps and Spasms Muscle cramps and spasms happen when muscles tighten on their own. They can be in any muscle. They happen most often in the muscles in the back of your lower leg (calf). Muscle cramps are painful. They are often stronger and last longer than muscle spasms. Muscle spasms may or may not be painful. In many cases, the cause of a muscle cramp or spasm is not known. But it may be from: Doing more work or exercise than your body is ready for. Using the muscles too much (overuse). Staying in one position for too long. Not preparing enough or having bad form when you play a sport or do an activity. Getting hurt. Other causes may include: Not enough water in your body (dehydration). Taking certain medicines. Not having enough salts and minerals in the body (electrolytes). Follow these instructions at home: Eating and drinking Drink enough fluid to keep your pee (urine) pale yellow. Eat a healthy diet to help your muscles work well. Your diet should include: Fruits and vegetables. Lean protein. Whole grains. Low-fat or nonfat dairy products. Managing pain and stiffness     Massage, stretch, and relax the muscle. Do this for a few minutes at a time. If told, put ice on the muscle. This may help if you are sore or have pain after a cramp or spasm. Put ice in a plastic bag. Place a towel between your skin and the bag. Leave the ice on for 20 minutes, 2-3 times a day. If told, put heat on tight or tense muscles. Do this as often as told by your doctor. Use the heat source that your doctor recommends, such as a moist heat pack or a heating pad. Place a towel between your skin and the heat source. Leave the heat on for 20-30 minutes. If your skin turns bright red, take off the ice or heat right away to prevent skin damage. The risk  of damage is higher if you cannot feel pain, heat, or cold. Take hot showers or baths to help relax the muscles. General instructions If you are having cramps often, avoid intense exercise for a few days. Take over-the-counter and prescription medicines only as told by your doctor. Watch for any changes in your symptoms. Contact a doctor if: Your cramps or spasms get worse or happen more often. Your cramps or spasms do not get better with time. This information is not intended to replace advice given to you by your health care provider. Make sure you discuss any questions you have with your health care provider. Document Revised: 06/08/2022 Document Reviewed: 06/08/2022 Elsevier Patient Education  [place].   HPV and Cancer Information HPV (human papillomavirus)is a very common virus that spreads easily from person to person through skin-to-skin or sexual contact. There are many types of HPV. It often does not cause symptoms. However, depending upon the type, it may sometimes cause warts in the genitals (genital or mucosal HPV), or on the hands or feet (cutaneous or nonmucosal HPV). It is possible to be infected for a long time and pass HPV to others without knowing it. Some HPV infections go away on their own within 2 years, but other HPV infections are considered high-risk and may cause changes in cells that could lead to cancer. You can take  steps to avoid HPV infection and to lower your risk of getting cancer. How can HPV affect me? HPV can cause warts in the genitals or on the hands or feet. It can also cause wart-like lesions in the throat.  Certain types of genital HPV can also cause cancer, which may include: Cervical cancer. Vaginal cancer. Vulvar cancer. Anal cancer. Throat cancer. Tongue or mouth cancer. Penile cancer. How does HPV spread? HPV spreads easily through direct person to person contact. Genital HPV spreads through sexual contact. You can get HPV from  vaginal sex, oral sex, anal sex, or just by touching someone's genitals. Even people who have only one sexual partner may have HPV because that partner may have it. HPV often does not cause symptoms, so most infected people do not know that they have it. What actions can I take to prevent HPV? Take the following steps to help prevent HPV infection: Talk with your health care provider about getting the HPV vaccine. This vaccine protects against the types of HPV that could cause cancer. Limit the number of people you have sex with. Also, avoid having sex with people who have had many sexual partners. Use a condom during sex. Talk with your sexual partners about their health. What actions can I take to lower my risk for cancer? Having a healthy lifestyle and taking some preventive steps can help lower your cancer risk, whether or not you have genital HPV. Some steps you can take include: Lifestyle Practice safe sex to help prevent HPV infection. Do not use any products that contain nicotine or tobacco, such as cigarettes, e-cigarettes, and chewing tobacco. If you need help quitting, ask your health care provider. Eat foods that have antioxidants, such as fruits, vegetables, and grains. Try to eat at least 5 servings of fruits and vegetables every day. Get regular exercise. Lose weight if you are overweight. Practice good oral hygiene. This includes flossing and brushing your teeth every day. Other preventive steps Get the HPV vaccine as told by your health care provider. Get tested for STIs even if you do not have symptoms of HPV. You may have HPV and not know it. If you are a woman, get regular Pap and HPV tests. Talk with your health care provider about how often you need these tests. Pap tests will help identify changes in cells that can lead to cancer. HPV tests will help identify the presence of HPV in cells in the cervix. Where to find more information Learn more about HPV and cancer  from: Centers for Disease Control and Prevention: http://sweeney-todd.com/ [place]: www.cancer.gov American Cancer Society: www.cancer.org Contact a health care provider if: You have genital warts. You are sexually active and think you may have HPV. You did not protect yourself during sex and would like to be tested for STIs. Summary Human papillomavirus (HPV) is a very common virus that spreads easily from person to person and ishighly contagious. Certain types of genital HPV are considered to be high risk and may cause changes in cells that could lead to cancer. You should take steps to avoid HPV infection, such as limiting the number of people you have sex with, using condoms during sex, and getting the HPV vaccine. Lifestyle changes can help lower your risk of cancer. These include eating a healthy diet, getting regular exercise, and not using any products that contain nicotine or tobacco. You may have HPV and not know it. Get tested for STIs even if you do not have symptoms  of HPV. If you are a woman, have regular Pap tests and HPV tests as directed by your health care provider. This information is not intended to replace advice given to you by your health care provider. Make sure you discuss any questions you have with your health care provider. Document Revised: 06/02/2020 Document Reviewed: 06/03/2020 Elsevier Patient Education  [place].

## 2023-02-02 NOTE — Assessment & Plan Note (Signed)
Asymptomatic. Follow up pending results.

## 2023-02-02 NOTE — Progress Notes (Signed)
Complete physical exam  Patient: Chad Simmons   DOB: Sep 26, 1996   27 y.o. Male  MRN: [id number]  Subjective:    Chief Complaint  Patient presents with   Annual Exam    Last ate around 2 am   He is fairly new to the practice and here for a complete physical exam.  C/o muscle twitching for years, different locations. Right side of face, arm, etc.  Muscle cramps in legs, usually in quads.  Exercises a lot.   Single Works as Airline pilot.      Health Maintenance  Topic Date Due   HPV Vaccine (3 - Male 3-dose series) 08/17/2021   COVID-19 Vaccine (1) 02/18/2023*   Flu Shot  06/02/2023   DTaP/Tdap/Td vaccine (8 - Td or Tdap) 05/26/2031   Hepatitis C Screening: USPSTF Recommendation to screen - Ages 36-79 yo.  Completed   HIV Screening  Completed  *Topic was postponed. The date shown is not the original due date.    Wears seatbelt always, uses sunscreen, smoke detectors in home and functioning, does not text while driving, feels safe in home environment.  Depression screening:    02/02/2023    8:49 AM 11/25/2022    8:39 AM 11/26/2021    8:34 AM  Depression screen PHQ 2/9  Decreased Interest 0 0 1  Down, Depressed, Hopeless 0 0 0  PHQ - 2 Score 0 0 1   Anxiety Screening:    04/30/2021   10:31 AM 04/05/2019    4:09 PM 12/28/2018    3:39 PM  GAD 7 : Generalized Anxiety Score  Nervous, Anxious, on Edge 1 0 0  Control/stop worrying 2 0 0  Worry too much - different things 0 0 0  Trouble relaxing 0 1 0  Restless 0 0 0  Easily annoyed or irritable 2 2 1   Afraid - awful might happen 0 0 0  Total GAD 7 Score 5 3 1     Vision:Within last year, Dental: No current dental problems and No regular dental care , and STD: requests testing   Patient Active Problem List   Diagnosis Date Noted   Encounter for general adult medical examination with abnormal findings 02/02/2023   Muscle spasms of both lower extremities 02/02/2023   Screen for STD (sexually transmitted disease)  02/02/2023   Prediabetes 05/25/2021   Generalized abdominal pain 05/01/2021   Frequent bowel movements 05/01/2021   Mild intermittent asthma without complication [id number]   Seasonal and perennial allergic rhinitis 10/12/2017   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   Muscle twitching 10/12/2017   Recent unexplained weight loss    Past Medical History:  Diagnosis Date   Abdominal pain    Asthma    Constipation    Decreased appetite    Eczema    Nausea    Weight loss    Past Surgical History:  Procedure Laterality Date   ORCHIOPEXY Bilateral 04/07/2022   Procedure: ORCHIOPEXY ADULT;  Surgeon: Festus Aloe, MD;  Location: [facility];  Service: Urology;  Laterality: Bilateral;   Social History   Tobacco Use   Smoking status: Never    Passive exposure: Never   Smokeless tobacco: Never  Vaping Use   Vaping Use: Never used  Substance Use Topics   Alcohol use: No   Drug use: No      Patient Care Team: Girtha Rm, NP-C as PCP - General (Family Medicine)   Outpatient Medications Prior to Visit  Medication Sig   albuterol (VENTOLIN  HFA) 108 (90 Base) MCG/ACT inhaler Inhale 2 puffs into the lungs every 4 (four) hours as needed for wheezing or shortness of breath (cough, shortness of breath or wheezing.).   cetirizine (ZYRTEC) 10 MG tablet Take 1 tablet (10 mg total) by mouth daily.   diclofenac (VOLTAREN) 75 MG EC tablet Take 75 mg by mouth 2 (two) times daily as needed.   EPINEPHrine 0.3 mg/0.3 mL IJ SOAJ injection Inject 0.3 mg into the muscle as needed for anaphylaxis. (Patient not taking: Reported on 11/25/2022)   fluticasone (FLONASE) 50 MCG/ACT nasal spray Place 1 spray into both nostrils daily. Begin by using 2 sprays in each nare daily for 3 to 5 days, then decrease to 1 spray in each nare daily. (Patient not taking: Reported on 11/25/2022)   No facility-administered medications prior to visit.    Review of Systems  Constitutional:  Negative for chills,  fever, malaise/fatigue and weight loss.  HENT:  Negative for congestion, ear pain, hearing loss, sinus pain, sore throat and tinnitus.   Eyes:  Negative for blurred vision, double vision and pain.  Respiratory:  Negative for cough, shortness of breath and wheezing.   Cardiovascular:  Negative for chest pain, palpitations and leg swelling.  Gastrointestinal:  Negative for abdominal pain, constipation, diarrhea, nausea and vomiting.  Genitourinary:  Negative for dysuria, frequency and urgency.  Musculoskeletal:  Negative for back pain, joint pain and myalgias.  Skin:  Negative for rash.  Neurological:  Negative for dizziness, tingling, tremors, sensory change, focal weakness and headaches.       Intermittent muscle twitching and cramps ongoing   Endo/Heme/Allergies:  Does not bruise/bleed easily.  Psychiatric/Behavioral:  Negative for depression, memory loss and suicidal ideas. The patient is not nervous/anxious.        Objective:    BP 120/76 (BP Location: Left Arm, Patient Position: Sitting, Cuff Size: Large)   Pulse 70   Temp 97.6 F (36.4 C) (Temporal)   Ht 5\' 8"  (1.727 m)   Wt 204 lb (92.5 kg)   SpO2 98%   BMI 31.02 kg/m  BP Readings from Last 3 Encounters:  02/02/23 120/76  11/25/22 118/72  11/10/22 (!) 149/74   Wt Readings from Last 3 Encounters:  02/02/23 204 lb (92.5 kg)  11/25/22 194 lb (88 kg)  06/04/22 190 lb (86.2 kg)    Physical Exam Constitutional:      General: He is not in acute distress.    Appearance: He is not ill-appearing.  HENT:     Right Ear: Tympanic membrane, ear canal and external ear normal.     Left Ear: Tympanic membrane, ear canal and external ear normal.     Nose: Nose normal.     Mouth/Throat:     Mouth: Mucous membranes are moist.     Pharynx: Oropharynx is clear.  Eyes:     Extraocular Movements: Extraocular movements intact.     Conjunctiva/sclera: Conjunctivae normal.     Pupils: Pupils are equal, round, and reactive to light.   Neck:     Thyroid: No thyroid mass, thyromegaly or thyroid tenderness.  Cardiovascular:     Rate and Rhythm: Normal rate and regular rhythm.     Pulses: Normal pulses.     Heart sounds: Normal heart sounds.  Pulmonary:     Effort: Pulmonary effort is normal.     Breath sounds: Normal breath sounds.  Abdominal:     General: Bowel sounds are normal. There is no distension.  Palpations: Abdomen is soft.     Tenderness: There is no abdominal tenderness. There is no right CVA tenderness, left CVA tenderness, guarding or rebound.  Musculoskeletal:        General: Normal range of motion.     Cervical back: Normal range of motion and neck supple. No tenderness.     Right lower leg: No edema.     Left lower leg: No edema.  Lymphadenopathy:     Cervical: No cervical adenopathy.  Skin:    General: Skin is warm and dry.     Findings: No lesion or rash.  Neurological:     General: No focal deficit present.     Mental Status: He is alert and oriented to person, place, and time.     Cranial Nerves: No cranial nerve deficit.     Sensory: No sensory deficit.     Motor: No weakness or tremor.  Psychiatric:        Mood and Affect: Mood normal.        Behavior: Behavior normal.        Thought Content: Thought content normal.       Results for orders placed or performed in visit on 02/02/23  Urinalysis, Routine w reflex microscopic  Result Value Ref Range   Color, Urine YELLOW Yellow;Lt. Yellow;Straw;Dark Yellow;Amber;Green;Red;Brown   APPearance CLEAR Clear;Turbid;Slightly Cloudy;Cloudy   Specific Gravity, Urine 1.020 1.000 - 1.030   pH 6.0 5.0 - 8.0   Total Protein, Urine NEGATIVE Negative   Urine Glucose NEGATIVE Negative   Ketones, ur NEGATIVE Negative   Bilirubin Urine NEGATIVE Negative   Hgb urine dipstick NEGATIVE Negative   Urobilinogen, UA 0.2 0.0 - 1.0   Leukocytes,Ua NEGATIVE Negative   Nitrite NEGATIVE Negative   WBC, UA none seen 0-2/hpf   RBC / HPF none seen 0-2/hpf   Magnesium  Result Value Ref Range   Magnesium 2.0 1.5 - 2.5 mg/dL  Vitamin B12  Result Value Ref Range   Vitamin B-12 712 211 - 911 pg/mL  T4, free  Result Value Ref Range   Free T4 0.85 0.60 - 1.60 ng/dL  TSH  Result Value Ref Range   TSH 1.92 0.35 - 5.50 uIU/mL  Lipid panel  Result Value Ref Range   Cholesterol 166 0 - 200 mg/dL   Triglycerides 68.0 0.0 - 149.0 mg/dL   HDL 54.60 >39.00 mg/dL   VLDL 13.6 0.0 - 40.0 mg/dL   LDL Cholesterol 98 0 - 99 mg/dL   Total CHOL/HDL Ratio 3    NonHDL 111.77   Hemoglobin A1c  Result Value Ref Range   Hgb A1c MFr Bld 6.1 4.6 - 6.5 %  Comprehensive metabolic panel  Result Value Ref Range   Sodium 137 135 - 145 mEq/L   Potassium 4.5 3.5 - 5.1 mEq/L   Chloride 102 96 - 112 mEq/L   CO2 29 19 - 32 mEq/L   Glucose, Bld 94 70 - 99 mg/dL   BUN 19 6 - 23 mg/dL   Creatinine, Ser 1.18 0.40 - 1.50 mg/dL   Total Bilirubin 0.3 0.2 - 1.2 mg/dL   Alkaline Phosphatase 58 39 - 117 U/L   AST 47 (H) 0 - 37 U/L   ALT 73 (H) 0 - 53 U/L   Total Protein 7.8 6.0 - 8.3 g/dL   Albumin 4.6 3.5 - 5.2 g/dL   GFR 86.30 >60.00 mL/min   Calcium 9.4 8.4 - 10.5 mg/dL  CBC with Differential/Platelet  Result Value Ref  Range   WBC 5.1 4.0 - 10.5 K/uL   RBC 5.35 4.22 - 5.81 Mil/uL   Hemoglobin 14.6 13.0 - 17.0 g/dL   HCT 44.9 39.0 - 52.0 %   MCV 83.9 78.0 - 100.0 fl   MCHC 32.4 30.0 - 36.0 g/dL   RDW 14.3 11.5 - 15.5 %   Platelets 227.0 150.0 - 400.0 K/uL   Neutrophils Relative % 38.7 (L) 43.0 - 77.0 %   Lymphocytes Relative 45.8 12.0 - 46.0 %   Monocytes Relative 6.6 3.0 - 12.0 %   Eosinophils Relative 7.6 (H) 0.0 - 5.0 %   Basophils Relative 1.3 0.0 - 3.0 %   Neutro Abs 2.0 1.4 - 7.7 K/uL   Lymphs Abs 2.4 0.7 - 4.0 K/uL   Monocytes Absolute 0.3 0.1 - 1.0 K/uL   Eosinophils Absolute 0.4 0.0 - 0.7 K/uL   Basophils Absolute 0.1 0.0 - 0.1 K/uL      Assessment & Plan:    Routine Health Maintenance and Physical Exam  Problem List Items Addressed This  Visit       Other   Encounter for general adult medical examination with abnormal findings - Primary    Preventive health care reviewed.  Counseling on healthy lifestyle including diet and exercise.  Recommend regular dental and eye exams.  Immunizations reviewed.  Discussed safety.       Relevant Orders   Urinalysis, Routine w reflex microscopic (Completed)   Muscle spasms of both lower extremities    Check labs and follow up      Relevant Orders   CBC with Differential/Platelet (Completed)   Comprehensive metabolic panel (Completed)   TSH (Completed)   T4, free (Completed)   Vitamin B12 (Completed)   Magnesium (Completed)   Muscle twitching    Ongoing for years. Would like to get to the cause of this. Check labs and follow up. Refer to neurology if no explanation.       Relevant Orders   CBC with Differential/Platelet (Completed)   Comprehensive metabolic panel (Completed)   TSH (Completed)   T4, free (Completed)   Vitamin B12 (Completed)   Magnesium (Completed)   Prediabetes    Advised low sugar, low carbohydrate diet and regular exercise.       Relevant Orders   Hemoglobin A1c (Completed)   Screen for STD (sexually transmitted disease)    Asymptomatic. Follow up pending results.       Relevant Orders   RPR   HIV Antibody (routine testing w rflx)   GC/Chlamydia Probe Amp   Other Visit Diagnoses     Screening for lipid disorders       Relevant Orders   Lipid panel (Completed)       Return in about 1 year (around 02/02/2024).     Harland Dingwall, NP-C

## 2023-02-02 NOTE — Assessment & Plan Note (Signed)
Advised low sugar, low carbohydrate diet and regular exercise.

## 2023-02-02 NOTE — Assessment & Plan Note (Signed)
Ongoing for years. Would like to get to the cause of this. Check labs and follow up. Refer to neurology if no explanation.

## 2023-02-03 ENCOUNTER — Other Ambulatory Visit: Payer: Self-pay | Admitting: Family Medicine

## 2023-02-03 DIAGNOSIS — R253 Fasciculation: Secondary | ICD-10-CM

## 2023-02-03 DIAGNOSIS — M62838 Other muscle spasm: Secondary | ICD-10-CM

## 2023-02-03 LAB — HIV ANTIBODY (ROUTINE TESTING W REFLEX): HIV 1&2 Ab, 4th Generation: NONREACTIVE

## 2023-02-03 LAB — RPR: RPR Ser Ql: NONREACTIVE

## 2023-02-03 NOTE — Progress Notes (Signed)
Needs ov scheduled in 2-3 weeks for elevated liver enzymes. See my note to him please

## 2023-02-06 LAB — GC/CHLAMYDIA PROBE AMP
Chlamydia trachomatis, NAA: NEGATIVE
Neisseria Gonorrhoeae by PCR: NEGATIVE

## 2023-05-16 ENCOUNTER — Ambulatory Visit: Payer: 59 | Admitting: Neurology

## 2023-06-22 ENCOUNTER — Ambulatory Visit
Admission: RE | Admit: 2023-06-22 | Discharge: 2023-06-22 | Disposition: A | Discharge status: Home / Self Care | Payer: 59 | Source: Ambulatory Visit | Attending: Internal Medicine | Admitting: Internal Medicine

## 2023-06-22 VITALS — BP 125/87 | HR 64 | Temp 99.1°F | Resp 14

## 2023-06-22 DIAGNOSIS — H6991 Unspecified Eustachian tube disorder, right ear: Secondary | ICD-10-CM | POA: Diagnosis not present

## 2023-06-22 MED ORDER — AMOXICILLIN 875 MG PO TABS: 875.0000 mg | ORAL_TABLET | Freq: Two times a day (BID) | ORAL | 0 refills | Status: DC

## 2023-06-22 MED ORDER — CETIRIZINE HCL 10 MG PO TABS: 10.0000 mg | ORAL_TABLET | Freq: Every day | ORAL | 1 refills | Status: DC

## 2023-06-22 MED ORDER — PSEUDOEPHEDRINE HCL 60 MG PO TABS: 60.0000 mg | ORAL_TABLET | Freq: Three times a day (TID) | ORAL | 0 refills | Status: DC | PRN

## 2023-06-22 MED ORDER — FLUTICASONE PROPIONATE 50 MCG/ACT NA SUSP: 1.0000 | Freq: Every day | NASAL | 2 refills | Status: DC

## 2023-06-22 NOTE — ED Triage Notes (Signed)
Pt reports and ringing in right ear x 1 day.

## 2023-06-22 NOTE — Discharge Instructions (Addendum)
If you end up getting fever, drainage, worsening ear pain, then go ahead and fill a prescription for amoxicillin to address a bacterial ear infection.

## 2023-06-22 NOTE — ED Provider Notes (Signed)
[facility]  Note:  This document was prepared using Conservation officer, historic buildings and may include unintentional dictation errors.  MRN: [id number] DOB: 1996/04/25  Subjective:   Chad Simmons is a 27 y.o. male presenting for 1 day history of intermittent ringing and discomfort of the right ear.  No fever, drainage of pus or bleeding, earwax problems.  Has previously had longstanding history of intermittent tinnitus but has never had to do anything about it.  Has a history of allergic rhinitis.  No sinus symptoms, sore throat, cough.  No smoking of any kind.  No asthma.  No current facility-administered medications for this encounter.  Current Outpatient Medications:    tizanidine (ZANAFLEX) 2 MG capsule, Take 2 mg by mouth 3 (three) times daily., Disp: , Rfl:    albuterol (VENTOLIN HFA) 108 (90 Base) MCG/ACT inhaler, Inhale 2 puffs into the lungs every 4 (four) hours as needed for wheezing or shortness of breath (cough, shortness of breath or wheezing.)., Disp: 18 g, Rfl: 1   cetirizine (ZYRTEC) 10 MG tablet, Take 1 tablet (10 mg total) by mouth daily., Disp: 90 tablet, Rfl: 1   diclofenac (VOLTAREN) 75 MG EC tablet, Take 75 mg by mouth 2 (two) times daily as needed., Disp: , Rfl:    EPINEPHrine 0.3 mg/0.3 mL IJ SOAJ injection, Inject 0.3 mg into the muscle as needed for anaphylaxis. (Patient not taking: Reported on 11/25/2022), Disp: 2 each, Rfl: 0   fluticasone (FLONASE) 50 MCG/ACT nasal spray, Place 1 spray into both nostrils daily. Begin by using 2 sprays in each nare daily for 3 to 5 days, then decrease to 1 spray in each nare daily. (Patient not taking: Reported on 11/25/2022), Disp: 15.8 mL, Rfl: 2   Allergies  Allergen Reactions   Peanut-Containing Drug Products Anaphylaxis    Past Medical History:  Diagnosis Date   Abdominal pain    Asthma    Constipation    Decreased appetite    Eczema    Nausea    Weight loss      Past Surgical History:   Procedure Laterality Date   ORCHIOPEXY Bilateral 04/07/2022   Procedure: ORCHIOPEXY ADULT;  Surgeon: Jerilee Field, MD;  Location: [facility];  Service: Urology;  Laterality: Bilateral;    Family History  Problem Relation Age of Onset   Diabetes Mother    Irritable bowel syndrome Mother    Cholelithiasis Neg Hx    Ulcers Neg Hx     Social History   Tobacco Use   Smoking status: Never    Passive exposure: Never   Smokeless tobacco: Never  Vaping Use   Vaping status: Never Used  Substance Use Topics   Alcohol use: No   Drug use: Never    ROS   Objective:   Vitals: BP 125/87 (BP Location: Right Arm)   Pulse 64   Temp 99.1 F (37.3 C) (Oral)   Resp 14   SpO2 97%   Physical Exam Constitutional:      General: He is not in acute distress.    Appearance: Normal appearance. He is normal weight. He is not ill-appearing, toxic-appearing or diaphoretic.  HENT:     Head: Normocephalic and atraumatic.     Right Ear: Tympanic membrane, ear canal and external ear normal. No drainage, swelling or tenderness. No middle ear effusion. There is no impacted cerumen. Tympanic membrane is not erythematous or bulging.     Left Ear: Tympanic membrane, ear canal and external ear  normal. No drainage, swelling or tenderness.  No middle ear effusion. There is no impacted cerumen. Tympanic membrane is not erythematous or bulging.     Nose: Nose normal. No congestion or rhinorrhea.     Mouth/Throat:     Mouth: Mucous membranes are moist.     Pharynx: No oropharyngeal exudate or posterior oropharyngeal erythema.  Eyes:     General: No scleral icterus.       Right eye: No discharge.        Left eye: No discharge.     Extraocular Movements: Extraocular movements intact.     Conjunctiva/sclera: Conjunctivae normal.  Cardiovascular:     Rate and Rhythm: Normal rate.  Pulmonary:     Effort: Pulmonary effort is normal.  Musculoskeletal:     Cervical back: Normal range of motion and neck  supple. No rigidity. No muscular tenderness.  Neurological:     General: No focal deficit present.     Mental Status: He is alert and oriented to person, place, and time.  Psychiatric:        Mood and Affect: Mood normal.        Behavior: Behavior normal.     Assessment and Plan :   PDMP not reviewed this encounter.  1. Eustachian tube dysfunction, right    Unremarkable ENT exam.  Will use conservative management for what I suspect is eustachian tube dysfunction.  Recommended starting Flonase, Zyrtec, Sudafed.  Patient is going to travel in the next couple of days, recommended he fill a prescription for amoxicillin if he develops fever, worsening and more consistent pain, drainage.  Otherwise discard the prescription.  Counseled patient on potential for adverse effects with medications prescribed/recommended today, ER and return-to-clinic precautions discussed, patient verbalized understanding.    Wallis Bamberg, [place] 06/22/23 1339

## 2023-06-30 ENCOUNTER — Ambulatory Visit: Payer: 59 | Admitting: Family Medicine

## 2023-06-30 ENCOUNTER — Encounter: Payer: Self-pay | Admitting: Family Medicine

## 2023-06-30 VITALS — BP 126/82 | HR 70 | Temp 97.6°F | Ht 68.0 in | Wt 205.0 lb

## 2023-06-30 DIAGNOSIS — R7303 Prediabetes: Secondary | ICD-10-CM | POA: Diagnosis not present

## 2023-06-30 DIAGNOSIS — R748 Abnormal levels of other serum enzymes: Secondary | ICD-10-CM | POA: Diagnosis not present

## 2023-06-30 DIAGNOSIS — H6991 Unspecified Eustachian tube disorder, right ear: Secondary | ICD-10-CM

## 2023-06-30 DIAGNOSIS — R253 Fasciculation: Secondary | ICD-10-CM

## 2023-06-30 LAB — CBC WITH DIFFERENTIAL/PLATELET
Basophils Absolute: 0.1 10*3/uL (ref 0.0–0.1)
Basophils Relative: 1.2 % (ref 0.0–3.0)
Eosinophils Absolute: 0.2 10*3/uL (ref 0.0–0.7)
Eosinophils Relative: 4.1 % (ref 0.0–5.0)
HCT: 45.4 % (ref 39.0–52.0)
Hemoglobin: 14.3 g/dL (ref 13.0–17.0)
Lymphocytes Relative: 42.1 % (ref 12.0–46.0)
Lymphs Abs: 2.4 10*3/uL (ref 0.7–4.0)
MCHC: 31.5 g/dL (ref 30.0–36.0)
MCV: 84.1 fl (ref 78.0–100.0)
Monocytes Absolute: 0.5 10*3/uL (ref 0.1–1.0)
Monocytes Relative: 7.9 % (ref 3.0–12.0)
Neutro Abs: 2.6 10*3/uL (ref 1.4–7.7)
Neutrophils Relative %: 44.7 % (ref 43.0–77.0)
Platelets: 218 10*3/uL (ref 150.0–400.0)
RBC: 5.4 Mil/uL (ref 4.22–5.81)
RDW: 14.5 % (ref 11.5–15.5)
WBC: 5.8 10*3/uL (ref 4.0–10.5)

## 2023-06-30 LAB — HEPATIC FUNCTION PANEL
ALT: 65 U/L — ABNORMAL HIGH (ref 0–53)
AST: 87 U/L — ABNORMAL HIGH (ref 0–37)
Albumin: 4.2 g/dL (ref 3.5–5.2)
Alkaline Phosphatase: 61 U/L (ref 39–117)
Bilirubin, Direct: 0.1 mg/dL (ref 0.0–0.3)
Total Bilirubin: 0.4 mg/dL (ref 0.2–1.2)
Total Protein: 7.6 g/dL (ref 6.0–8.3)

## 2023-06-30 LAB — BASIC METABOLIC PANEL
BUN: 14 mg/dL (ref 6–23)
CO2: 29 meq/L (ref 19–32)
Calcium: 9.6 mg/dL (ref 8.4–10.5)
Chloride: 102 meq/L (ref 96–112)
Creatinine, Ser: 1.27 mg/dL (ref 0.40–1.50)
GFR: 78.79 mL/min (ref >60.00)
Glucose, Bld: 94 mg/dL (ref 70–99)
Potassium: 4.3 meq/L (ref 3.5–5.1)
Sodium: 138 meq/L (ref 135–145)

## 2023-06-30 LAB — HEMOGLOBIN A1C: Hgb A1c MFr Bld: 6.3 % (ref 4.6–6.5)

## 2023-06-30 NOTE — Progress Notes (Signed)
Subjective:     Patient ID: Chad Simmons, male    DOB: 08/23/1996, 27 y.o.   MRN: [id number]  Chief Complaint  Patient presents with   Spasms    Muscle spams and twitching, referred to neuro but had to r/s and hasn't been able to see them bc they wont pick up calls    HPI  Discussed the use of AI scribe software for clinical note transcription with the patient, who gave verbal consent to proceed.  History of Present Illness          Here to follow up on abnormal LFTs.   Does not drink. No regular NSAID use.   He is also c/o worsening muscle twitches. Referred to neurology but has not seen them yet.    Health Maintenance Due  Topic Date Due   HPV VACCINES (3 - Male 3-dose series) 08/17/2021   COVID-19 Vaccine (1 - 2023-24 season) Never done   INFLUENZA VACCINE  06/02/2023    Past Medical History:  Diagnosis Date   Abdominal pain    Asthma    Constipation    Decreased appetite    Eczema    Nausea    Weight loss     Past Surgical History:  Procedure Laterality Date   ORCHIOPEXY Bilateral 04/07/2022   Procedure: ORCHIOPEXY ADULT;  Surgeon: Jerilee Field, MD;  Location: [facility];  Service: Urology;  Laterality: Bilateral;    Family History  Problem Relation Age of Onset   Diabetes Mother    Irritable bowel syndrome Mother    Cholelithiasis Neg Hx    Ulcers Neg Hx     Social History   Socioeconomic History   Marital status: Single    Spouse name: Not on file   Number of children: Not on file   Years of education: Not on file   Highest education level: Not on file  Occupational History   Not on file  Tobacco Use   Smoking status: Never    Passive exposure: Never   Smokeless tobacco: Never  Vaping Use   Vaping status: Never Used  Substance and Sexual Activity   Alcohol use: No   Drug use: Never   Sexual activity: Yes  Other Topics Concern   Not on file  Social History Narrative   9th grade 2014-2015   Social Determinants of Health    Financial Resource Strain: Not on file  Food Insecurity: Not on file  Transportation Needs: Not on file  Physical Activity: Not on file  Stress: Not on file  Social Connections: Not on file  Intimate Partner Violence: Not on file    Outpatient Medications Prior to Visit  Medication Sig Dispense Refill   albuterol (VENTOLIN HFA) 108 (90 Base) MCG/ACT inhaler Inhale 2 puffs into the lungs every 4 (four) hours as needed for wheezing or shortness of breath (cough, shortness of breath or wheezing.). 18 g 1   cetirizine (ZYRTEC) 10 MG tablet Take 1 tablet (10 mg total) by mouth daily. 90 tablet 1   fluticasone (FLONASE) 50 MCG/ACT nasal spray Place 1 spray into both nostrils daily. Begin by using 2 sprays in each nare daily for 3 to 5 days, then decrease to 1 spray in each nare daily. 15.8 mL 2   EPINEPHrine 0.3 mg/0.3 mL IJ SOAJ injection Inject 0.3 mg into the muscle as needed for anaphylaxis. (Patient not taking: Reported on 11/25/2022) 2 each 0   tizanidine (ZANAFLEX) 2 MG capsule Take 2 mg by mouth 3 (  three) times daily. (Patient not taking: Reported on 06/30/2023)     amoxicillin (AMOXIL) 875 MG tablet Take 1 tablet (875 mg total) by mouth 2 (two) times daily. (Patient not taking: Reported on 06/30/2023) 14 tablet 0   diclofenac (VOLTAREN) 75 MG EC tablet Take 75 mg by mouth 2 (two) times daily as needed. (Patient not taking: Reported on 06/30/2023)     pseudoephedrine (SUDAFED) 60 MG tablet Take 1 tablet (60 mg total) by mouth every 8 (eight) hours as needed for congestion. 30 tablet 0   No facility-administered medications prior to visit.    Allergies  Allergen Reactions   Peanut-Containing Drug Products Anaphylaxis    Review of Systems  Constitutional:  Negative for chills, fever and malaise/fatigue.  HENT:  Negative for ear pain and tinnitus.   Respiratory:  Negative for shortness of breath.   Cardiovascular:  Negative for chest pain, palpitations and leg swelling.   Gastrointestinal:  Negative for abdominal pain, constipation, diarrhea, nausea and vomiting.  Genitourinary:  Negative for dysuria, frequency and urgency.  Skin:  Negative for rash.  Neurological:  Negative for dizziness.       Objective:    Physical Exam Constitutional:      General: He is not in acute distress.    Appearance: He is not ill-appearing.  Eyes:     Extraocular Movements: Extraocular movements intact.     Conjunctiva/sclera: Conjunctivae normal.  Cardiovascular:     Rate and Rhythm: Normal rate.  Pulmonary:     Effort: Pulmonary effort is normal.  Musculoskeletal:     Cervical back: Normal range of motion and neck supple.  Skin:    General: Skin is warm and dry.  Neurological:     General: No focal deficit present.     Mental Status: He is alert and oriented to person, place, and time.  Psychiatric:        Mood and Affect: Mood normal.        Behavior: Behavior normal.        Thought Content: Thought content normal.      BP 126/82 (BP Location: Left Arm, Patient Position: Sitting, Cuff Size: Large)   Pulse 70   Temp 97.6 F (36.4 C) (Temporal)   Ht 5\' 8"  (1.727 m)   Wt 205 lb (93 kg)   SpO2 98%   BMI 31.17 kg/m  Wt Readings from Last 3 Encounters:  06/30/23 205 lb (93 kg)  02/02/23 204 lb (92.5 kg)  11/25/22 194 lb (88 kg)       Assessment & Plan:   Problem List Items Addressed This Visit       Other   Muscle twitching - Primary   Relevant Orders   CBC with Differential/Platelet   Vitamin B12   TSH   Basic metabolic panel   Prediabetes   Relevant Orders   CBC with Differential/Platelet   Hemoglobin A1c   Basic metabolic panel   Other Visit Diagnoses     Elevated liver enzymes       Relevant Orders   Basic metabolic panel   Hepatic function panel   Dysfunction of right eustachian tube          Recheck hepatic function and A1c. He will schedule with neurology for muscle twitches, worsening.  Follow up pending lab results.    I have discontinued Peterson Gradilla's diclofenac, pseudoephedrine, and amoxicillin. I am also having him maintain his albuterol, EPINEPHrine, tizanidine, cetirizine, and fluticasone.  No orders of the defined  types were placed in this encounter.

## 2023-07-01 ENCOUNTER — Other Ambulatory Visit: Payer: Self-pay | Admitting: Family Medicine

## 2023-07-01 DIAGNOSIS — R748 Abnormal levels of other serum enzymes: Secondary | ICD-10-CM

## 2023-07-01 LAB — TSH: TSH: 1.85 u[IU]/mL (ref 0.35–5.50)

## 2023-07-01 LAB — VITAMIN B12: Vitamin B-12: 631 pg/mL (ref 211–911)

## 2023-07-01 NOTE — Progress Notes (Signed)
His liver enzymes are higher now. I am referring him to [facility] for further evaluation given he does not drink alcohol or take any medications that could be causing this.  Also, his blood sugars are still in the prediabetes range but getting closer to diabetes. I recommend limiting foods high in sugar and carbohydrates such as potatoes, bread, pasta, rice etc. Make sure to exercise (cardio type in addition to weight training) at least 150 minutes each week.

## 2023-07-12 ENCOUNTER — Ambulatory Visit
Admission: RE | Admit: 2023-07-12 | Discharge: 2023-07-12 | Disposition: A | Discharge status: Home / Self Care | Payer: 59 | Source: Ambulatory Visit | Attending: Family Medicine | Admitting: Family Medicine

## 2023-07-12 VITALS — BP 140/76 | HR 83 | Temp 98.4°F | Resp 16

## 2023-07-12 DIAGNOSIS — J069 Acute upper respiratory infection, unspecified: Secondary | ICD-10-CM | POA: Insufficient documentation

## 2023-07-12 DIAGNOSIS — Z1152 Encounter for screening for COVID-19: Secondary | ICD-10-CM | POA: Diagnosis not present

## 2023-07-12 MED ORDER — PROMETHAZINE-DM 6.25-15 MG/5ML PO SYRP: 5.0000 mL | ORAL_SOLUTION | Freq: Four times a day (QID) | ORAL | 0 refills | Status: DC | PRN

## 2023-07-12 NOTE — ED Triage Notes (Signed)
Pt c/o nasal congestion, sinus pain/pressure, sore throat, dry cough x 2 days-denies fever-taking dayquil and nyquil, zyrtec, flonase, tylenol-NAD-steady gait

## 2023-07-12 NOTE — ED Provider Notes (Signed)
[facility]    CSN: [id number] Arrival date & time: 07/12/23  0954      History   Chief Complaint Chief Complaint  Patient presents with   Sore Throat    Headache and Runny Nose - Entered by patient   Nasal Congestion    HPI Chad Simmons is a 27 y.o. male.   Patient presenting today with 2-day history of nasal congestion, facial pain and pressure, sore throat, dry cough, body aches.  Denies fever, chest pain, shortness of breath, abdominal pain, nausea vomiting or diarrhea.  So far trying DayQuil, NyQuil, Zyrtec, Flonase, Tylenol with mild temporary benefit.  Does have a history of asthma on albuterol as needed but has not been using it since symptoms started.  Multiple sick contacts recently.    Past Medical History:  Diagnosis Date   Abdominal pain    Asthma    Constipation    Decreased appetite    Eczema    Nausea    Weight loss     Patient Active Problem List   Diagnosis Date Noted   Elevated liver enzymes 07/01/2023   Encounter for general adult medical examination with abnormal findings 02/02/2023   Muscle spasms of both lower extremities 02/02/2023   Screen for STD (sexually transmitted disease) 02/02/2023   Prediabetes 05/25/2021   Generalized abdominal pain 05/01/2021   Frequent bowel movements 05/01/2021   Mild intermittent asthma without complication 10/12/2017   Seasonal and perennial allergic rhinitis 10/12/2017   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 Authorizing Provider  promethazine-dextromethorphan (PROMETHAZINE-DM) 6.25-15 MG/5ML syrup Take 5 mLs  by mouth 4 (four) times daily as needed for cough. 07/12/23  Yes Particia Nearing, PA-C  albuterol (VENTOLIN HFA) 108 (90 Base) MCG/ACT inhaler Inhale 2 puffs into the lungs every 4 (four) hours as needed for wheezing or shortness of breath (cough, shortness of breath or wheezing.). 05/15/22   Theadora Rama Scales, PA-C  cetirizine (ZYRTEC) 10 MG tablet Take 1 tablet (10 mg total) by mouth daily. 06/22/23 12/19/23  Wallis Bamberg, PA-C  EPINEPHrine 0.3 mg/0.3 mL IJ SOAJ injection Inject 0.3 mg into the muscle as needed for anaphylaxis. Patient not taking: Reported on 11/25/2022 11/10/22   Wallis Bamberg, PA-C  fluticasone [facility]) 50 MCG/ACT nasal spray Place 1 spray into both nostrils daily. Begin by using 2 sprays in each nare daily for 3 to 5 days, then decrease to 1 spray in each nare daily. 06/22/23   Wallis Bamberg, PA-C  tizanidine (ZANAFLEX) 2 MG capsule Take 2 mg by mouth 3 (three) times daily. Patient not taking: Reported on 06/30/2023    [provider]    Family History Family History  Problem Relation Age of Onset   Diabetes Mother    Irritable bowel syndrome Mother    Cholelithiasis Neg Hx    Ulcers Neg Hx     Social History Social History   Tobacco Use   Smoking status: Never    Passive exposure: Never   Smokeless tobacco: Never  Vaping Use   Vaping status: Never Used  Substance Use Topics  Alcohol use: No   Drug use: Never     Allergies   Peanut-containing drug products   Review of Systems Review of Systems PER HPI  Physical Exam Triage Vital Signs ED Triage Vitals  Encounter Vitals Group     BP 07/12/23 1024 (!) 140/76     Systolic BP Percentile --      Diastolic BP Percentile --      Pulse Rate 07/12/23 1024 83     Resp 07/12/23 1024 16     Temp 07/12/23 1024 98.4 F (36.9 C)     Temp Source 07/12/23 1024 Oral     SpO2 07/12/23 1024 95 %     Weight --      Height --      Head Circumference --      Peak Flow --      Pain Score 07/12/23  1022 6     Pain Loc --      Pain Education --      Exclude from Growth Chart --    No data found.  Updated Vital Signs BP (!) 140/76 (BP Location: Left Arm)   Pulse 83   Temp 98.4 F (36.9 C) (Oral)   Resp 16   SpO2 95%   Visual Acuity Right Eye Distance:   Left Eye Distance:   Bilateral Distance:    Right Eye Near:   Left Eye Near:    Bilateral Near:     Physical Exam Vitals and nursing note reviewed.  Constitutional:      Appearance: He is well-developed.  HENT:     Head: Atraumatic.     Right Ear: External ear normal.     Left Ear: External ear normal.     Nose: Rhinorrhea present.     Mouth/Throat:     Pharynx: Posterior oropharyngeal erythema present. No oropharyngeal exudate.  Eyes:     Conjunctiva/sclera: Conjunctivae normal.     Pupils: Pupils are equal, round, and reactive to light.  Cardiovascular:     Rate and Rhythm: Normal rate and regular rhythm.  Pulmonary:     Effort: Pulmonary effort is normal. No respiratory distress.     Breath sounds: No wheezing or rales.  Musculoskeletal:        General: Normal range of motion.     Cervical back: Normal range of motion and neck supple.  Lymphadenopathy:     Cervical: No cervical adenopathy.  Skin:    General: Skin is warm and dry.  Neurological:     Mental Status: He is alert and oriented to person, place, and time.  Psychiatric:        Behavior: Behavior normal.      UC Treatments / Results  Labs (all labs ordered are listed, but only abnormal results are displayed) Labs Reviewed  SARS CORONAVIRUS 2 (TAT 6-24 HRS)    EKG   Radiology No results found.  Procedures Procedures (including critical care time)  Medications Ordered in UC Medications - No data to display  Initial Impression / Assessment and Plan / UC Course  I have reviewed the triage vital signs and the nursing notes.  Pertinent labs & imaging results that were available during my care of the patient were reviewed by me  and considered in my medical decision making (see chart for details).     Vitals and exam overall reassuring today, suspicious for viral respiratory infection.  COVID testing pending, treat with over-the-counter cold congestion medications, albuterol as needed, supportive home  care.  Return precautions reviewed.  Work note given.  Final Clinical Impressions(s) / UC Diagnoses   Final diagnoses:  Viral URI with cough     Discharge Instructions      Use your albuterol inhaler every 6 hours as needed for the dry cough, I have also sent over a cough syrup for you to try in addition to the over-the-counter cold and congestion medications.  Your COVID test should be back tomorrow.    ED Prescriptions     Medication Sig Dispense Auth. Provider   promethazine-dextromethorphan (PROMETHAZINE-DM) 6.25-15 MG/5ML syrup Take 5 mLs by mouth 4 (four) times daily as needed for cough. 118 mL Particia Nearing, [place]      PDMP not reviewed this encounter.   Particia Nearing, [place] 07/12/23 1036

## 2023-07-12 NOTE — Discharge Instructions (Signed)
Use your albuterol inhaler every 6 hours as needed for the dry cough, I have also sent over a cough syrup for you to try in addition to the over-the-counter cold and congestion medications.  Your COVID test should be back tomorrow.

## 2023-07-13 LAB — SARS CORONAVIRUS 2 (TAT 6-24 HRS): SARS Coronavirus 2: NEGATIVE

## 2023-07-18 ENCOUNTER — Encounter: Payer: Self-pay | Admitting: Family Medicine

## 2023-07-21 ENCOUNTER — Encounter: Payer: Self-pay | Admitting: Gastroenterology

## 2023-08-18 ENCOUNTER — Ambulatory Visit: Payer: 59 | Admitting: Gastroenterology

## 2023-08-31 ENCOUNTER — Ambulatory Visit: Payer: 59 | Admitting: Family Medicine

## 2023-09-07 ENCOUNTER — Encounter: Payer: Self-pay | Admitting: Family Medicine

## 2023-09-07 ENCOUNTER — Ambulatory Visit: Payer: 59 | Admitting: Family Medicine

## 2023-09-07 VITALS — BP 122/64 | HR 78 | Temp 97.8°F | Ht 68.0 in | Wt 209.0 lb

## 2023-09-07 DIAGNOSIS — R7303 Prediabetes: Secondary | ICD-10-CM

## 2023-09-07 DIAGNOSIS — R051 Acute cough: Secondary | ICD-10-CM

## 2023-09-07 DIAGNOSIS — J3489 Other specified disorders of nose and nasal sinuses: Secondary | ICD-10-CM

## 2023-09-07 DIAGNOSIS — R748 Abnormal levels of other serum enzymes: Secondary | ICD-10-CM | POA: Diagnosis not present

## 2023-09-07 DIAGNOSIS — J3089 Other allergic rhinitis: Secondary | ICD-10-CM

## 2023-09-07 DIAGNOSIS — J4520 Mild intermittent asthma, uncomplicated: Secondary | ICD-10-CM

## 2023-09-07 DIAGNOSIS — R0981 Nasal congestion: Secondary | ICD-10-CM

## 2023-09-07 DIAGNOSIS — G44209 Tension-type headache, unspecified, not intractable: Secondary | ICD-10-CM

## 2023-09-07 DIAGNOSIS — J302 Other seasonal allergic rhinitis: Secondary | ICD-10-CM | POA: Diagnosis not present

## 2023-09-07 LAB — POC COVID19 BINAXNOW: SARS Coronavirus 2 Ag: NEGATIVE

## 2023-09-07 MED ORDER — FLUTICASONE PROPIONATE 50 MCG/ACT NA SUSP
1.0000 | Freq: Every day | NASAL | 2 refills | Status: DC
Start: 2023-09-07 — End: 2024-05-31

## 2023-09-07 MED ORDER — PREDNISONE 20 MG PO TABS
40.0000 mg | ORAL_TABLET | Freq: Every day | ORAL | 0 refills | Status: DC
Start: 2023-09-07 — End: 2024-05-31

## 2023-09-07 NOTE — Patient Instructions (Addendum)
Take the steroids by mouth daily in the morning with food.  Use your albuterol inhaler.  Use Flonase and continue Zyrtec daily.  Stay well-hydrated.  Please drop off or send me your lab results from your employer.  I recommend that you call and schedule with GI for your elevated liver function test.  [facility] at [phone]

## 2023-09-07 NOTE — Progress Notes (Signed)
Subjective:     Patient ID: Chad Simmons, male    DOB: 30-Apr-1996, 27 y.o.   MRN: [id number]  Chief Complaint  Patient presents with   Headache    Consistent headaches for 2 weeks    Headache  Associated symptoms include coughing. Pertinent negatives include no abdominal pain, dizziness, fever, nausea or vomiting.    History of Present Illness         C/o bilateral frontal headache that radiates to the back of his head x 2 weeks. Feels like pressure. Pain is constant but varies in intensity. No vision changes.  States headache started after having a neck adjustment as chiropractor. Denies neck pain, numbness, tingling or weakness.  Today pain is 3/10. Takes Advil migraine medication which resolves pain temporarily.  ? Hx of migraine.  C/o rhinorrhea, nasal congestion, coughing for the past week. Cough and shortness of breath.  Allergies to grass and he has been cutting grass.  He is taking Zyrtec and using  Ran out of Flonase.     Health Maintenance Due  Topic Date Due   HPV VACCINES (3 - Male 3-dose series) 08/17/2021   INFLUENZA VACCINE  Never done   COVID-19 Vaccine (1 - 2023-24 season) Never done    Past Medical History:  Diagnosis Date   Abdominal pain    Asthma    Constipation    Decreased appetite    Eczema    Nausea    Weight loss     Past Surgical History:  Procedure Laterality Date   ORCHIOPEXY Bilateral 04/07/2022   Procedure: ORCHIOPEXY ADULT;  Surgeon: Jerilee Field, MD;  Location: [facility];  Service: Urology;  Laterality: Bilateral;    Family History  Problem Relation Age of Onset   Diabetes Mother    Irritable bowel syndrome Mother    Cholelithiasis Neg Hx    Ulcers Neg Hx     Social History   Socioeconomic History   Marital status: Single    Spouse name: Not on file   Number of children: Not on file   Years of education: Not on file   Highest education level: Not on file  Occupational History   Not on file  Tobacco Use    Smoking status: Never    Passive exposure: Never   Smokeless tobacco: Never  Vaping Use   Vaping status: Never Used  Substance and Sexual Activity   Alcohol use: No   Drug use: Never   Sexual activity: Yes  Other Topics Concern   Not on file  Social History Narrative   9th grade 2014-2015   Social Determinants of Health   Financial Resource Strain: Not on file  Food Insecurity: Not on file  Transportation Needs: Not on file  Physical Activity: Not on file  Stress: Not on file  Social Connections: Not on file  Intimate Partner Violence: Not on file    Outpatient Medications Prior to Visit  Medication Sig Dispense Refill   albuterol (VENTOLIN HFA) 108 (90 Base) MCG/ACT inhaler Inhale 2 puffs into the lungs every 4 (four) hours as needed for wheezing or shortness of breath (cough, shortness of breath or wheezing.). 18 g 1   cetirizine (ZYRTEC) 10 MG tablet Take 1 tablet (10 mg total) by mouth daily. 90 tablet 1   tizanidine (ZANAFLEX) 2 MG capsule Take 2 mg by mouth 3 (three) times daily.     fluticasone (FLONASE) 50 MCG/ACT nasal spray Place 1 spray into both nostrils daily. Begin by using  2 sprays in each nare daily for 3 to 5 days, then decrease to 1 spray in each nare daily. 15.8 mL 2   EPINEPHrine 0.3 mg/0.3 mL IJ SOAJ injection Inject 0.3 mg into the muscle as needed for anaphylaxis. (Patient not taking: Reported on 11/25/2022) 2 each 0   promethazine-dextromethorphan (PROMETHAZINE-DM) 6.25-15 MG/5ML syrup Take 5 mLs by mouth 4 (four) times daily as needed for cough. (Patient not taking: Reported on 09/07/2023) 118 mL 0   No facility-administered medications prior to visit.    Allergies  Allergen Reactions   Peanut-Containing Drug Products Anaphylaxis    Review of Systems  Constitutional:  Negative for chills and fever.  HENT:  Positive for congestion.   Respiratory:  Positive for cough, shortness of breath and wheezing.   Cardiovascular:  Negative for chest pain,  palpitations and leg swelling.  Gastrointestinal:  Negative for abdominal pain, constipation, diarrhea, nausea and vomiting.  Genitourinary:  Negative for dysuria, frequency and urgency.  Neurological:  Positive for headaches. Negative for dizziness and focal weakness.       Objective:    Physical Exam Constitutional:      General: He is not in acute distress.    Appearance: He is not ill-appearing.  HENT:     Mouth/Throat:     Mouth: Mucous membranes are moist.     Pharynx: Oropharynx is clear.  Eyes:     Extraocular Movements: Extraocular movements intact.     Conjunctiva/sclera: Conjunctivae normal.  Cardiovascular:     Rate and Rhythm: Normal rate and regular rhythm.  Pulmonary:     Effort: Pulmonary effort is normal.     Breath sounds: Normal breath sounds.  Musculoskeletal:        General: Normal range of motion.     Cervical back: Normal range of motion and neck supple. No rigidity.  Lymphadenopathy:     Cervical: No cervical adenopathy.  Skin:    General: Skin is warm and dry.  Neurological:     General: No focal deficit present.     Mental Status: He is alert and oriented to person, place, and time.     Cranial Nerves: No cranial nerve deficit.     Sensory: No sensory deficit.     Motor: No weakness.  Psychiatric:        Mood and Affect: Mood normal.        Speech: Speech normal.        Behavior: Behavior normal.        Thought Content: Thought content normal.      BP 122/64 (BP Location: Left Arm, Patient Position: Sitting, Cuff Size: Large)   Pulse 78   Temp 97.8 F (36.6 C) (Temporal)   Ht 5\' 8"  (1.727 m)   Wt 209 lb (94.8 kg)   SpO2 97%   BMI 31.78 kg/m  Wt Readings from Last 3 Encounters:  09/07/23 209 lb (94.8 kg)  06/30/23 205 lb (93 kg)  02/02/23 204 lb (92.5 kg)       Assessment & Plan:   Problem List Items Addressed This Visit       Respiratory   Mild intermittent asthma without complication   Relevant Medications   predniSONE  (DELTASONE) 20 MG tablet   Seasonal and perennial allergic rhinitis   Relevant Medications   fluticasone (FLONASE) 50 MCG/ACT nasal spray     Other   Elevated liver enzymes   Prediabetes   Other Visit Diagnoses     Acute non intractable  tension-type headache    -  Primary   Relevant Medications   predniSONE (DELTASONE) 20 MG tablet   Acute cough       Relevant Medications   predniSONE (DELTASONE) 20 MG tablet   Other Relevant Orders   POC COVID-19 (Completed)   Nasal congestion with rhinorrhea       Relevant Orders   POC COVID-19 (Completed)      Negative COVID test. Recommend treating allergies with Zyrtec and Flonase.  Flonase refilled. Oral steroids prescribed for headache and allergies triggering asthma. No acute exacerbation at this time although he is coughing frequently during the visit. Avoid allergens Use albuterol as needed. Follow-up if not improving in the next week. Elevated LFTs and he did not see GI. He declines to recheck liver tests today. States he had labs done last week with employer and is going to get his results this week.  Advised him that he should follow up on abnormal liver tests. He will call GI to schedule.   I have discontinued Bronislaw Appelhans's promethazine-dextromethorphan. I am also having him start on predniSONE. Additionally, I am having him maintain his albuterol, EPINEPHrine, tizanidine, cetirizine, and fluticasone.  Meds ordered this encounter  Medications   fluticasone (FLONASE) 50 MCG/ACT nasal spray    Sig: Place 1 spray into both nostrils daily. Begin by using 2 sprays in each nare daily for 3 to 5 days, then decrease to 1 spray in each nare daily.    Dispense:  15.8 mL    Refill:  2    Order Specific Question:   Supervising Provider    Answer:   Hillard Danker A [[id number]]   predniSONE (DELTASONE) 20 MG tablet    Sig: Take 2 tablets (40 mg total) by mouth daily with breakfast.    Dispense:  10 tablet    Refill:  0    Order  Specific Question:   Supervising Provider    Answer:   Hillard Danker A [[id number]]

## 2023-09-08 ENCOUNTER — Ambulatory Visit: Payer: 59 | Admitting: Family Medicine

## 2023-11-03 ENCOUNTER — Ambulatory Visit: Payer: 59 | Admitting: Gastroenterology

## 2023-11-03 ENCOUNTER — Encounter: Payer: Self-pay | Admitting: Gastroenterology

## 2023-11-03 ENCOUNTER — Other Ambulatory Visit: Payer: 59

## 2023-11-03 VITALS — BP 120/76 | HR 75 | Ht 67.0 in | Wt 207.4 lb

## 2023-11-03 DIAGNOSIS — R7401 Elevation of levels of liver transaminase levels: Secondary | ICD-10-CM | POA: Diagnosis not present

## 2023-11-03 DIAGNOSIS — R748 Abnormal levels of other serum enzymes: Secondary | ICD-10-CM

## 2023-11-03 LAB — PROTIME-INR
INR: 1.1 {ratio} — ABNORMAL HIGH (ref 0.8–1.0)
Prothrombin Time: 11.8 s (ref 9.6–13.1)

## 2023-11-03 LAB — HEPATIC FUNCTION PANEL
ALT: 26 U/L (ref 0–53)
AST: 31 U/L (ref 0–37)
Albumin: 4.1 g/dL (ref 3.5–5.2)
Alkaline Phosphatase: 66 U/L (ref 39–117)
Bilirubin, Direct: 0.1 mg/dL (ref 0.0–0.3)
Total Bilirubin: 0.7 mg/dL (ref 0.2–1.2)
Total Protein: 7.8 g/dL (ref 6.0–8.3)

## 2023-11-03 LAB — IBC + FERRITIN
Ferritin: 83.4 ng/mL (ref 22.0–322.0)
Iron: 143 ug/dL (ref 42–165)
Saturation Ratios: 40.2 % (ref 20.0–50.0)
TIBC: 355.6 ug/dL (ref 250.0–450.0)
Transferrin: 254 mg/dL (ref 212.0–360.0)

## 2023-11-03 NOTE — Patient Instructions (Signed)
 Your provider has requested that you go to the basement level for lab work before leaving today. Press B on the elevator. The lab is located at the first door on the left as you exit the elevator.  You have been scheduled for an abdominal ultrasound at [facility] (1st floor of hospital) on Tuesday 11/08/23 at 9 am. Please arrive 30 minutes prior to your appointment for registration. Make certain not to have anything to eat or drink 6 hours prior to your appointment. Should you need to reschedule your appointment, please contact radiology at [phone]. This test typically takes about 30 minutes to perform.  _______________________________________________________  If your blood pressure at your visit was 140/90 or greater, please contact your primary care physician to follow up on this.  _______________________________________________________  If you are age 26 or older, your body mass index should be between 23-30. Your Body mass index is 32.48 kg/m. If this is out of the aforementioned range listed, please consider follow up with your Primary Care Provider.  If you are age 64 or younger, your body mass index should be between 19-25. Your Body mass index is 32.48 kg/m. If this is out of the aformentioned range listed, please consider follow up with your Primary Care Provider.   ________________________________________________________  The [facility] providers would like to encourage you to use MYCHART to communicate with providers for non-urgent requests or questions.  Due to long hold times on the telephone, sending your provider a message by [facility] may be a faster and more efficient way to get a response.  Please allow 48 business hours for a response.  Please remember that this is for non-urgent requests.  _______________________________________________________

## 2023-11-03 NOTE — Progress Notes (Signed)
 11/03/2023 Jerona Dollar [id number] 1995-11-06   HISTORY OF PRESENT ILLNESS: This is a 28 year old male who is new to our office.  Has been referred here by Boby Mackintosh, NP, for evaluation of elevated LFTs.  AST 47 and ALT 73 nine months.  AST 87 and ALT 65 four months ago.    Total bili and ALP normal.  He does not drink any alcohol.  Does not use any drugs.  No vitamins, supplements, minerals except for protein powder when he works out.  Rare Tylenol  or NSAID use.  He says that he gets some vitamin packs from [facility], but only remembers to take them a couple times a month.  No family history of liver disease.  No GI complaints.   Past Medical History:  Diagnosis Date   Abdominal pain    Asthma    Constipation    Decreased appetite    Eczema    Nausea    Weight loss    Past Surgical History:  Procedure Laterality Date   ORCHIOPEXY Bilateral 04/07/2022   Procedure: ORCHIOPEXY ADULT;  Surgeon: Nieves Cough, MD;  Location: [facility];  Service: Urology;  Laterality: Bilateral;    reports that he has never smoked. He has never been exposed to tobacco smoke. He has never used smokeless tobacco. He reports that he does not drink alcohol and does not use drugs. family history includes Diabetes in his mother; Irritable bowel syndrome in his mother. Allergies  Allergen Reactions   Peanut-Containing Drug Products Anaphylaxis      Outpatient Encounter Medications as of 11/03/2023  Medication Sig   albuterol  (VENTOLIN  HFA) 108 (90 Base) MCG/ACT inhaler Inhale 2 puffs into the lungs every 4 (four) hours as needed for wheezing or shortness of breath (cough, shortness of breath or wheezing.).   cetirizine  (ZYRTEC ) 10 MG tablet Take 1 tablet (10 mg total) by mouth daily.   diclofenac  (CATAFLAM ) 50 MG tablet Take 50 mg by mouth as needed.   EPINEPHrine  0.3 mg/0.3 mL IJ SOAJ injection Inject 0.3 mg into the muscle as needed for anaphylaxis.   fluticasone  (FLONASE ) 50 MCG/ACT nasal spray  Place 1 spray into both nostrils daily. Begin by using 2 sprays in each nare daily for 3 to 5 days, then decrease to 1 spray in each nare daily.   predniSONE  (DELTASONE ) 20 MG tablet Take 2 tablets (40 mg total) by mouth daily with breakfast.   tizanidine (ZANAFLEX) 2 MG capsule Take 2 mg by mouth 3 (three) times daily.   No facility-administered encounter medications on file as of 11/03/2023.    REVIEW OF SYSTEMS  : All other systems reviewed and negative except where noted in the History of Present Illness.   PHYSICAL EXAM: BP 120/76   Pulse 75   Ht 5' 7 (1.702 m)   Wt 207 lb 6.4 oz (94.1 kg)   SpO2 97%   BMI 32.48 kg/m  General: Well developed male in no acute distress Head: Normocephalic and atraumatic Eyes:  Sclerae anicteric, conjunctiva pink. Ears: Normal auditory acuity Lungs: Clear throughout to auscultation; no W/R/R. Heart: Regular rate and rhythm; no M/R/G. Musculoskeletal: Symmetrical with no gross deformities  Skin: No lesions on visible extremities Extremities: No edema  Neurological: Alert oriented x 4, grossly non-focal Psychological:  Alert and cooperative. Normal mood and affect  ASSESSMENT AND PLAN: *28 year-old male with mildly elevated AST and ALT to 87 and 73, respectively at their highest.  Elevated just on the last 2 occasions over  the past 9 months.  Previously normal.  Alk phos and total bili normal.  Will check a right upper quadrant abdominal ultrasound and extensive liver serologies.  Does not drink alcohol, take any drugs, supplements, etc.  No family history of liver disease.   CC:  Henson, Vickie L, NP-C

## 2023-11-04 NOTE — Progress Notes (Signed)
 ____________________________________________________________  Attending physician addendum:  Thank you for sending this case to me. I have reviewed the entire note and agree with the plan.   Amada Jupiter, MD  ____________________________________________________________

## 2023-11-05 LAB — MITOCHONDRIAL ANTIBODIES: Mitochondrial M2 Ab, IgG: <=20 U (ref <=20.0)

## 2023-11-05 LAB — HEPATITIS A ANTIBODY, TOTAL: Hepatitis A AB,Total: REACTIVE — AB

## 2023-11-05 LAB — ALPHA-1-ANTITRYPSIN: A-1 Antitrypsin, Ser: 135 mg/dL (ref 83–199)

## 2023-11-05 LAB — ANTI-SMOOTH MUSCLE ANTIBODY, IGG: Actin (Smooth Muscle) Antibody (IGG): <20 U (ref <20)

## 2023-11-05 LAB — HEPATITIS B SURFACE ANTIBODY,QUALITATIVE: Hep B S Ab: REACTIVE — AB

## 2023-11-05 LAB — ANA: Anti Nuclear Antibody (ANA): NEGATIVE

## 2023-11-05 LAB — IGG: IgG (Immunoglobin G), Serum: 1789 mg/dL — ABNORMAL HIGH (ref 600–1640)

## 2023-11-05 LAB — HEPATITIS B SURFACE ANTIGEN: Hepatitis B Surface Ag: NONREACTIVE

## 2023-11-05 LAB — TISSUE TRANSGLUTAMINASE ABS,IGG,IGA
(tTG) Ab, IgA: <1 U/mL
(tTG) Ab, IgG: <1 U/mL

## 2023-11-05 LAB — HEPATITIS C ANTIBODY: Hepatitis C Ab: NONREACTIVE

## 2023-11-05 LAB — CERULOPLASMIN: Ceruloplasmin: 25 mg/dL (ref 14–30)

## 2023-11-05 LAB — IGA: Immunoglobulin A: 196 mg/dL (ref 47–310)

## 2023-11-08 ENCOUNTER — Ambulatory Visit (HOSPITAL_COMMUNITY)
Admission: RE | Admit: 2023-11-08 | Discharge: 2023-11-08 | Disposition: A | Discharge status: Home / Self Care | Payer: 59 | Source: Ambulatory Visit | Attending: Gastroenterology | Admitting: Gastroenterology

## 2023-11-08 DIAGNOSIS — R748 Abnormal levels of other serum enzymes: Secondary | ICD-10-CM | POA: Diagnosis present

## 2023-12-04 ENCOUNTER — Ambulatory Visit
Admission: EM | Admit: 2023-12-04 | Discharge: 2023-12-04 | Disposition: A | Discharge status: Home / Self Care | Payer: 59 | Attending: Family Medicine | Admitting: Family Medicine

## 2023-12-04 DIAGNOSIS — J4530 Mild persistent asthma, uncomplicated: Secondary | ICD-10-CM

## 2023-12-04 DIAGNOSIS — B349 Viral infection, unspecified: Secondary | ICD-10-CM | POA: Diagnosis not present

## 2023-12-04 MED ORDER — CETIRIZINE HCL 10 MG PO TABS: 10.0000 mg | ORAL_TABLET | Freq: Every day | ORAL | 0 refills | Status: DC

## 2023-12-04 MED ORDER — PROMETHAZINE-DM 6.25-15 MG/5ML PO SYRP: 5.0000 mL | ORAL_SOLUTION | Freq: Three times a day (TID) | ORAL | 0 refills | Status: DC | PRN

## 2023-12-04 MED ORDER — PSEUDOEPHEDRINE HCL 60 MG PO TABS: 60.0000 mg | ORAL_TABLET | Freq: Three times a day (TID) | ORAL | 0 refills | Status: DC | PRN

## 2023-12-04 NOTE — Discharge Instructions (Signed)
We will notify you of your test results as they arrive and may take between about 24 hours.  I encourage you to sign up for MyChart if you have not already done so as this can be the easiest way for [place] to communicate results to you online or through a phone app.  Generally, we only contact you if it is a positive test result.  In the meantime, if you develop worsening symptoms including fever, chest pain, shortness of breath despite our current treatment plan then please report to the emergency room as this may be a sign of worsening status from possible viral infection.  Otherwise, we will manage this as a viral syndrome. For sore throat or cough try using a honey-based tea. Use 3 teaspoons of honey with juice squeezed from half lemon. Place shaved pieces of ginger into 1/2-1 cup of water and warm over stove top. Then mix the ingredients and repeat every 4 hours as needed. Please take Tylenol 500mg -650mg  every 6 hours for aches and pains, fevers. Hydrate very well with at least 2 liters of water. Eat light meals such as soups to replenish electrolytes and soft fruits, veggies. Start an antihistamine like Zyrtec (10mg  daily) for postnasal drainage, sinus congestion.  You can take this together with pseudoephedrine (Sudafed) at a dose of 60 mg 2-3 times a day as needed for the same kind of congestion.  Use albuterol inhaler as needed. Use the cough medications as needed.

## 2023-12-04 NOTE — ED Provider Notes (Signed)
[facility]  Note:  This document was prepared using Conservation officer, historic buildings and may include unintentional dictation errors.  MRN: [id number] DOB: [birth year]  Subjective:   Chad Simmons is a 28 y.o. male presenting for 1 day history of malaise, fatigue, runny and stuffy nose, throat pain, headaches, coughing.  Also had a fever.  Needs documentation for work.  Would like a COVID test.  He does have a history of mild intermittent asthma.  No current shortness of breath, wheezing.  No smoking of any kind including cigarettes, cigars, vaping, marijuana use.    No current facility-administered medications for this encounter.  Current Outpatient Medications:    albuterol (VENTOLIN HFA) 108 (90 Base) MCG/ACT inhaler, Inhale 2 puffs into the lungs every 4 (four) hours as needed for wheezing or shortness of breath (cough, shortness of breath or wheezing.)., Disp: 18 g, Rfl: 1   cetirizine (ZYRTEC) 10 MG tablet, Take 1 tablet (10 mg total) by mouth daily., Disp: 90 tablet, Rfl: 1   diclofenac (CATAFLAM) 50 MG tablet, Take 50 mg by mouth as needed., Disp: , Rfl:    EPINEPHrine 0.3 mg/0.3 mL IJ SOAJ injection, Inject 0.3 mg into the muscle as needed for anaphylaxis., Disp: 2 each, Rfl: 0   fluticasone (FLONASE) 50 MCG/ACT nasal spray, Place 1 spray into both nostrils daily. Begin by using 2 sprays in each nare daily for 3 to 5 days, then decrease to 1 spray in each nare daily., Disp: 15.8 mL, Rfl: 2   predniSONE (DELTASONE) 20 MG tablet, Take 2 tablets (40 mg total) by mouth daily with breakfast., Disp: 10 tablet, Rfl: 0   tizanidine (ZANAFLEX) 2 MG capsule, Take 2 mg by mouth 3 (three) times daily., Disp: , Rfl:    Allergies  Allergen Reactions   Peanut-Containing Drug Products Anaphylaxis    Past Medical History:  Diagnosis Date   Abdominal pain    Asthma    Constipation    Decreased appetite    Eczema    Nausea    Weight loss      Past Surgical  History:  Procedure Laterality Date   ORCHIOPEXY Bilateral 04/07/2022   Procedure: ORCHIOPEXY ADULT;  Surgeon: Jerilee Field, MD;  Location: [facility];  Service: Urology;  Laterality: Bilateral;    Family History  Problem Relation Age of Onset   Diabetes Mother    Irritable bowel syndrome Mother    Cholelithiasis Neg Hx    Ulcers Neg Hx    Colon cancer Neg Hx    Esophageal cancer Neg Hx    Stomach cancer Neg Hx     Social History   Tobacco Use   Smoking status: Never    Passive exposure: Never   Smokeless tobacco: Never  Vaping Use   Vaping status: Never Used  Substance Use Topics   Alcohol use: No   Drug use: Never    ROS   Objective:   Vitals: BP 127/74 (BP Location: Left Arm)   Pulse 73   Temp 98.8 F (37.1 C) (Oral)   Resp 16   SpO2 96%   Physical Exam Constitutional:      General: He is not in acute distress.    Appearance: Normal appearance. He is well-developed and normal weight. He is not ill-appearing, toxic-appearing or diaphoretic.  HENT:     Head: Normocephalic and atraumatic.     Right Ear: External ear normal.     Left Ear: External ear normal.  Nose: Nose normal. No congestion or rhinorrhea.     Mouth/Throat:     Mouth: Mucous membranes are moist.     Pharynx: No pharyngeal swelling, oropharyngeal exudate, posterior oropharyngeal erythema or uvula swelling.     Tonsils: No tonsillar exudate or tonsillar abscesses. 0 on the right. 0 on the left.  Eyes:     General: No scleral icterus.       Right eye: No discharge.        Left eye: No discharge.     Extraocular Movements: Extraocular movements intact.  Cardiovascular:     Rate and Rhythm: Normal rate and regular rhythm.     Heart sounds: Normal heart sounds. No murmur heard.    No friction rub. No gallop.  Pulmonary:     Effort: Pulmonary effort is normal. No respiratory distress.     Breath sounds: Normal breath sounds. No stridor. No wheezing, rhonchi or rales.  Musculoskeletal:      Cervical back: Normal range of motion.  Neurological:     Mental Status: He is alert and oriented to person, place, and time.  Psychiatric:        Mood and Affect: Mood normal.        Behavior: Behavior normal.        Thought Content: Thought content normal.        Judgment: Judgment normal.     Assessment and Plan :   PDMP not reviewed this encounter.  1. Acute viral syndrome   2. Mild persistent asthma, uncomplicated    Defer prednisone given clear pulmonary exam and lack of respiratory symptoms.  Will manage for viral illness such as viral URI, viral syndrome, viral rhinitis, COVID-19. Recommended supportive care. Offered scripts for symptomatic relief. Testing is pending. Counseled patient on potential for adverse effects with medications prescribed/recommended today, ER and return-to-clinic precautions discussed, patient verbalized understanding.     Wallis Bamberg, [place] 12/04/23 [phone]

## 2023-12-04 NOTE — ED Triage Notes (Signed)
Pt c/o cough, HA, runny nose, sore throat started ~4am-fever while at work today-NAD-steady gait

## 2023-12-05 LAB — SARS CORONAVIRUS 2 (TAT 6-24 HRS): SARS Coronavirus 2: NEGATIVE

## 2024-05-31 ENCOUNTER — Ambulatory Visit: Payer: Self-pay | Admitting: Family Medicine

## 2024-05-31 ENCOUNTER — Encounter: Payer: Self-pay | Admitting: Family Medicine

## 2024-05-31 ENCOUNTER — Ambulatory Visit: Admitting: Family Medicine

## 2024-05-31 VITALS — BP 108/70 | HR 65 | Temp 97.8°F | Ht 67.0 in | Wt 221.0 lb

## 2024-05-31 DIAGNOSIS — R7303 Prediabetes: Secondary | ICD-10-CM | POA: Diagnosis not present

## 2024-05-31 DIAGNOSIS — J4520 Mild intermittent asthma, uncomplicated: Secondary | ICD-10-CM

## 2024-05-31 DIAGNOSIS — E66811 Obesity, class 1: Secondary | ICD-10-CM | POA: Diagnosis not present

## 2024-05-31 DIAGNOSIS — J302 Other seasonal allergic rhinitis: Secondary | ICD-10-CM

## 2024-05-31 DIAGNOSIS — R748 Abnormal levels of other serum enzymes: Secondary | ICD-10-CM | POA: Diagnosis not present

## 2024-05-31 DIAGNOSIS — L2082 Flexural eczema: Secondary | ICD-10-CM | POA: Insufficient documentation

## 2024-05-31 DIAGNOSIS — J3089 Other allergic rhinitis: Secondary | ICD-10-CM | POA: Diagnosis not present

## 2024-05-31 DIAGNOSIS — M545 Low back pain, unspecified: Secondary | ICD-10-CM | POA: Insufficient documentation

## 2024-05-31 LAB — CBC WITH DIFFERENTIAL/PLATELET
Basophils Absolute: 0.1 K/uL (ref 0.0–0.1)
Basophils Relative: 1.4 % (ref 0.0–3.0)
Eosinophils Absolute: 0.3 K/uL (ref 0.0–0.7)
Eosinophils Relative: 6.6 % — ABNORMAL HIGH (ref 0.0–5.0)
HCT: 45.6 % (ref 39.0–52.0)
Hemoglobin: 14.7 g/dL (ref 13.0–17.0)
Lymphocytes Relative: 45.9 % (ref 12.0–46.0)
Lymphs Abs: 2.2 K/uL (ref 0.7–4.0)
MCHC: 32.3 g/dL (ref 30.0–36.0)
MCV: 83.6 fl (ref 78.0–100.0)
Monocytes Absolute: 0.4 K/uL (ref 0.1–1.0)
Monocytes Relative: 7.3 % (ref 3.0–12.0)
Neutro Abs: 1.9 K/uL (ref 1.4–7.7)
Neutrophils Relative %: 38.8 % — ABNORMAL LOW (ref 43.0–77.0)
Platelets: 236 K/uL (ref 150.0–400.0)
RBC: 5.46 Mil/uL (ref 4.22–5.81)
RDW: 14.3 % (ref 11.5–15.5)
WBC: 4.9 K/uL (ref 4.0–10.5)

## 2024-05-31 LAB — BASIC METABOLIC PANEL WITH GFR
BUN: 15 mg/dL (ref 6–23)
CO2: 31 meq/L (ref 19–32)
Calcium: 9.5 mg/dL (ref 8.4–10.5)
Chloride: 101 meq/L (ref 96–112)
Creatinine, Ser: 1.26 mg/dL (ref 0.40–1.50)
GFR: 79.02 mL/min (ref >60.00)
Glucose, Bld: 95 mg/dL (ref 70–99)
Potassium: 3.9 meq/L (ref 3.5–5.1)
Sodium: 138 meq/L (ref 135–145)

## 2024-05-31 LAB — URINALYSIS, ROUTINE W REFLEX MICROSCOPIC
Bilirubin Urine: NEGATIVE
Hgb urine dipstick: NEGATIVE
Ketones, ur: NEGATIVE
Leukocytes,Ua: NEGATIVE
Nitrite: NEGATIVE
Specific Gravity, Urine: 1.02 (ref 1.000–1.030)
Total Protein, Urine: NEGATIVE
Urine Glucose: NEGATIVE
Urobilinogen, UA: 0.2 (ref 0.0–1.0)
pH: 6 (ref 5.0–8.0)

## 2024-05-31 LAB — HEPATIC FUNCTION PANEL
ALT: 29 U/L (ref 0–53)
AST: 29 U/L (ref 0–37)
Albumin: 4.5 g/dL (ref 3.5–5.2)
Alkaline Phosphatase: 63 U/L (ref 39–117)
Bilirubin, Direct: 0.1 mg/dL (ref 0.0–0.3)
Total Bilirubin: 0.3 mg/dL (ref 0.2–1.2)
Total Protein: 8 g/dL (ref 6.0–8.3)

## 2024-05-31 LAB — HEMOGLOBIN A1C: Hgb A1c MFr Bld: 6.4 % (ref 4.6–6.5)

## 2024-05-31 LAB — TSH: TSH: 0.98 u[IU]/mL (ref 0.35–5.50)

## 2024-05-31 MED ORDER — DICLOFENAC POTASSIUM 50 MG PO TABS
50.0000 mg | ORAL_TABLET | ORAL | 0 refills | Status: AC | PRN
Start: 2024-05-31 — End: ?

## 2024-05-31 MED ORDER — LEVOCETIRIZINE DIHYDROCHLORIDE 5 MG PO TABS: 5.0000 mg | ORAL_TABLET | Freq: Every evening | ORAL | 2 refills | Status: AC

## 2024-05-31 MED ORDER — TRIAMCINOLONE ACETONIDE 0.5 % EX OINT: 1.0000 | TOPICAL_OINTMENT | Freq: Two times a day (BID) | CUTANEOUS | 0 refills | Status: AC

## 2024-05-31 MED ORDER — FLUTICASONE PROPIONATE 50 MCG/ACT NA SUSP: 1.0000 | Freq: Every day | NASAL | 2 refills | Status: AC

## 2024-05-31 NOTE — Assessment & Plan Note (Signed)
 Previous A1c 6.3%.  Advised low sugar, low carbohydrate diet and regular exercise.  Check A1c and renal function and follow-up

## 2024-05-31 NOTE — Assessment & Plan Note (Signed)
 Controlled.  No recent flares.

## 2024-05-31 NOTE — Assessment & Plan Note (Signed)
 History of the same in childhood.  Triamcinolone  prescribed.  Recommend using over-the-counter moisturizer such as Aveeno or CeraVe.  Follow-up if worsening

## 2024-05-31 NOTE — Assessment & Plan Note (Signed)
 Yesterday has increased muscle mass, not only weight gain.  He is taking supplements to help him with this.  Check labs including liver and renal function.

## 2024-05-31 NOTE — Progress Notes (Signed)
 Subjective:     Patient ID: Chad Simmons, male    DOB: October 24, 1996, 28 y.o.   MRN: [id number]  Chief Complaint  Patient presents with   Eczema    Eczema flare up, only uses lotion so doesn't know what else he needs to do for it.   Also states doesn't think allergy medication is working anymore    HPI  Discussed the use of AI scribe software for clinical note transcription with the patient, who gave verbal consent to proceed.  History of Present Illness  He is here to follow-up on chronic health conditions as well as for uncontrolled allergies.  He has a new concern regarding eczema type rash on his arms.  Reports having a similar rash issue as a child.  He is not putting anything on this.  Denies any asthma flares.  He has gained weight.  Working on increasing muscle mass.  States he has been taking mass gainer for the past 4-5 months.   History of elevated liver function tests and saw GI.  His liver function normalized at that time.  Low back pain intermittent. Takes pain medication 1-2 times per month.     Health Maintenance Due  Topic Date Due   Pneumococcal Vaccine: 19-49 Years (1 of 1 - PPSV23, PCV20, or PCV21) 06/17/2004   HPV VACCINES (3 - Male 3-dose series) 08/17/2021   COVID-19 Vaccine (1 - 2024-25 season) Never done    Past Medical History:  Diagnosis Date   Abdominal pain    Asthma    Constipation    Decreased appetite    Eczema    Nausea    Weight loss     Past Surgical History:  Procedure Laterality Date   ORCHIOPEXY Bilateral 04/07/2022   Procedure: ORCHIOPEXY ADULT;  Surgeon: Nieves Cough, MD;  Location: [facility];  Service: Urology;  Laterality: Bilateral;    Family History  Problem Relation Age of Onset   Diabetes Mother    Irritable bowel syndrome Mother    Cholelithiasis Neg Hx    Ulcers Neg Hx    Colon cancer Neg Hx    Esophageal cancer Neg Hx    Stomach cancer Neg Hx     Social History   Socioeconomic History   Marital  status: Single    Spouse name: Not on file   Number of children: Not on file   Years of education: Not on file   Highest education level: Not on file  Occupational History   Not on file  Tobacco Use   Smoking status: Never    Passive exposure: Never   Smokeless tobacco: Never  Vaping Use   Vaping status: Never Used  Substance and Sexual Activity   Alcohol use: No   Drug use: Never   Sexual activity: Yes  Other Topics Concern   Not on file  Social History Narrative   9th grade 2014-2015   Social Drivers of Health   Financial Resource Strain: Not on file  Food Insecurity: Not on file  Transportation Needs: Not on file  Physical Activity: Not on file  Stress: Not on file  Social Connections: Not on file  Intimate Partner Violence: Not on file    Outpatient Medications Prior to Visit  Medication Sig Dispense Refill   albuterol  (VENTOLIN  HFA) 108 (90 Base) MCG/ACT inhaler Inhale 2 puffs into the lungs every 4 (four) hours as needed for wheezing or shortness of breath (cough, shortness of breath or wheezing.). 18 g 1  tizanidine (ZANAFLEX) 2 MG capsule Take 2 mg by mouth 3 (three) times daily.     cetirizine  (ZYRTEC  ALLERGY) 10 MG tablet Take 1 tablet (10 mg total) by mouth daily. 30 tablet 0   diclofenac  (CATAFLAM ) 50 MG tablet Take 50 mg by mouth as needed.     fluticasone  (FLONASE ) 50 MCG/ACT nasal spray Place 1 spray into both nostrils daily. Begin by using 2 sprays in each nare daily for 3 to 5 days, then decrease to 1 spray in each nare daily. 15.8 mL 2   EPINEPHrine  0.3 mg/0.3 mL IJ SOAJ injection Inject 0.3 mg into the muscle as needed for anaphylaxis. (Patient not taking: Reported on 05/31/2024) 2 each 0   predniSONE  (DELTASONE ) 20 MG tablet Take 2 tablets (40 mg total) by mouth daily with breakfast. 10 tablet 0   promethazine -dextromethorphan (PROMETHAZINE -DM) 6.25-15 MG/5ML syrup Take 5 mLs by mouth 3 (three) times daily as needed for cough. 200 mL 0   pseudoephedrine   (SUDAFED) 60 MG tablet Take 1 tablet (60 mg total) by mouth every 8 (eight) hours as needed for congestion. 30 tablet 0   No facility-administered medications prior to visit.    Allergies  Allergen Reactions   Peanut-Containing Drug Products Anaphylaxis    Review of Systems  Constitutional:  Negative for chills, fever, malaise/fatigue and weight loss.  HENT:  Positive for congestion. Negative for sinus pain and sore throat.   Respiratory:  Negative for cough, shortness of breath and wheezing.   Cardiovascular:  Negative for chest pain, palpitations and leg swelling.  Gastrointestinal:  Negative for abdominal pain, constipation, diarrhea, nausea and vomiting.  Genitourinary:  Negative for dysuria, frequency and urgency.  Skin:  Positive for itching and rash.  Neurological:  Negative for dizziness, focal weakness and headaches.  Psychiatric/Behavioral:  Negative for depression. The patient is not nervous/anxious.        Objective:    Physical Exam Constitutional:      General: He is not in acute distress.    Appearance: He is not ill-appearing.  HENT:     Mouth/Throat:     Mouth: Mucous membranes are moist.     Pharynx: Oropharynx is clear.  Eyes:     Extraocular Movements: Extraocular movements intact.     Conjunctiva/sclera: Conjunctivae normal.  Cardiovascular:     Rate and Rhythm: Normal rate.  Pulmonary:     Effort: Pulmonary effort is normal.     Breath sounds: Normal breath sounds.  Musculoskeletal:     Cervical back: Normal range of motion and neck supple.  Skin:    General: Skin is warm and dry.     Findings: Rash present.     Comments: Eczema rash in antecubital areas bilaterally L>R  Neurological:     General: No focal deficit present.     Mental Status: He is alert and oriented to person, place, and time.     Motor: No weakness.     Coordination: Coordination normal.     Gait: Gait normal.  Psychiatric:        Mood and Affect: Mood normal.         Behavior: Behavior normal.        Thought Content: Thought content normal.      BP 108/70   Pulse 65   Temp 97.8 F (36.6 C) (Temporal)   Ht 5' 7 (1.702 m)   Wt 221 lb (100.2 kg)   SpO2 98%   BMI 34.61 kg/m  Wt Readings from  Last 3 Encounters:  05/31/24 221 lb (100.2 kg)  11/03/23 207 lb 6.4 oz (94.1 kg)  09/07/23 209 lb (94.8 kg)       Assessment & Plan:   Problem List Items Addressed This Visit     Elevated liver enzymes   Review of previous labs and GI visit showed his liver function returned to normal.  Recheck hepatic panel today.      Relevant Orders   CBC with Differential/Platelet (Completed)   Basic metabolic panel with GFR (Completed)   Hepatic function panel (Completed)   Urinalysis, Routine w reflex microscopic (Completed)   Flexural eczema   History of the same in childhood.  Triamcinolone  prescribed.  Recommend using over-the-counter moisturizer such as Aveeno or CeraVe.  Follow-up if worsening      Low back pain without sciatica   Intermittent.  Refilled diclofenac  which he takes 1-2 times per month.  He will follow-up with Ortho if worsening.      Relevant Medications   diclofenac  (CATAFLAM ) 50 MG tablet   Mild intermittent asthma without complication   Controlled.  No recent flares.      Obesity (BMI 30.0-34.9)   Yesterday has increased muscle mass, not only weight gain.  He is taking supplements to help him with this.  Check labs including liver and renal function.       Relevant Orders   CBC with Differential/Platelet (Completed)   Basic metabolic panel with GFR (Completed)   Hemoglobin A1c (Completed)   Hepatic function panel (Completed)   TSH (Completed)   Prediabetes - Primary   Previous A1c 6.3%.  Advised low sugar, low carbohydrate diet and regular exercise.  Check A1c and renal function and follow-up      Relevant Orders   CBC with Differential/Platelet (Completed)   Basic metabolic panel with GFR (Completed)   Hemoglobin A1c  (Completed)   TSH (Completed)   Urinalysis, Routine w reflex microscopic (Completed)   Seasonal and perennial allergic rhinitis   Switch from Zyrtec  to Xyzal .  Restart Flonase .  Follow-up if no improvement      Relevant Medications   fluticasone  (FLONASE ) 50 MCG/ACT nasal spray     I have discontinued Jacksyn Tortorelli's predniSONE , promethazine -dextromethorphan, pseudoephedrine , and cetirizine . I have also changed his diclofenac . Additionally, I am having him start on levocetirizine and triamcinolone  ointment. Lastly, I am having him maintain his albuterol , EPINEPHrine , tizanidine, and fluticasone .  Meds ordered this encounter  Medications   diclofenac  (CATAFLAM ) 50 MG tablet    Sig: Take 1 tablet (50 mg total) by mouth as needed.    Dispense:  30 tablet    Refill:  0   levocetirizine (XYZAL ) 5 MG tablet    Sig: Take 1 tablet (5 mg total) by mouth every evening.    Dispense:  30 tablet    Refill:  2    Supervising Provider:   ROLLENE NORRIS A [[id number]]   fluticasone  (FLONASE ) 50 MCG/ACT nasal spray    Sig: Place 1 spray into both nostrils daily. Begin by using 2 sprays in each nare daily for 3 to 5 days, then decrease to 1 spray in each nare daily.    Dispense:  15.8 mL    Refill:  2    Supervising Provider:   ROLLENE NORRIS A [[id number]]   triamcinolone  ointment (KENALOG ) 0.5 %    Sig: Apply 1 Application topically 2 (two) times daily.    Dispense:  30 g    Refill:  0    Supervising  Provider:   ROLLENE NORRIS A [[id number]]

## 2024-05-31 NOTE — Patient Instructions (Addendum)
 Please go downstairs for your labs and a urine test before you leave.   Start Xyzal  and Flonase  for allergies  Use the steroid ointment on the areas that itch and the mend of your arm for the next 1 to 2 weeks and then stop.  Purchase a moisturizer that is thick such as Aveeno or CeraVe for eczema and keep your skin well moisturized.  I will be in touch with your results.

## 2024-05-31 NOTE — Assessment & Plan Note (Signed)
 Review of previous labs and GI visit showed his liver function returned to normal.  Recheck hepatic panel today.

## 2024-05-31 NOTE — Assessment & Plan Note (Signed)
 Switch from Zyrtec  to Xyzal .  Restart Flonase .  Follow-up if no improvement

## 2024-05-31 NOTE — Assessment & Plan Note (Addendum)
 Intermittent.  Refilled diclofenac  which he takes 1-2 times per month.  He will follow-up with Ortho if worsening.

## 2024-07-20 ENCOUNTER — Ambulatory Visit: Admitting: Family Medicine

## 2024-11-06 ENCOUNTER — Encounter: Payer: Self-pay | Admitting: Family Medicine

## 2024-11-06 ENCOUNTER — Ambulatory Visit: Admitting: Family Medicine

## 2024-11-06 ENCOUNTER — Ambulatory Visit: Payer: Self-pay | Admitting: Family Medicine

## 2024-11-06 VITALS — BP 108/78 | HR 66 | Temp 97.9°F | Ht 67.0 in | Wt 220.0 lb

## 2024-11-06 DIAGNOSIS — M25522 Pain in left elbow: Secondary | ICD-10-CM

## 2024-11-06 DIAGNOSIS — M7702 Medial epicondylitis, left elbow: Secondary | ICD-10-CM

## 2024-11-06 DIAGNOSIS — E7800 Pure hypercholesterolemia, unspecified: Secondary | ICD-10-CM

## 2024-11-06 DIAGNOSIS — R7303 Prediabetes: Secondary | ICD-10-CM

## 2024-11-06 LAB — COMPREHENSIVE METABOLIC PANEL WITH GFR
ALT: 19 U/L (ref 3–53)
AST: 27 U/L (ref 5–37)
Albumin: 4.6 g/dL (ref 3.5–5.2)
Alkaline Phosphatase: 69 U/L (ref 39–117)
BUN: 21 mg/dL (ref 6–23)
CO2: 29 meq/L (ref 19–32)
Calcium: 9.5 mg/dL (ref 8.4–10.5)
Chloride: 102 meq/L (ref 96–112)
Creatinine, Ser: 1.25 mg/dL (ref 0.40–1.50)
GFR: 79.54 mL/min
Glucose, Bld: 101 mg/dL — ABNORMAL HIGH (ref 70–99)
Potassium: 4.4 meq/L (ref 3.5–5.1)
Sodium: 136 meq/L (ref 135–145)
Total Bilirubin: 0.5 mg/dL (ref 0.2–1.2)
Total Protein: 7.9 g/dL (ref 6.0–8.3)

## 2024-11-06 LAB — HEMOGLOBIN A1C: Hgb A1c MFr Bld: 6.3 % (ref 4.6–6.5)

## 2024-11-06 LAB — TSH: TSH: 0.94 u[IU]/mL (ref 0.35–5.50)

## 2024-11-06 NOTE — Patient Instructions (Addendum)
 Please go downstairs for labs before you leave.  I referred you to [facility] and you should be able to schedule a visit with them.   Limit heavy lifting or activities that worsen your elbow pain.  Try icing and topical Voltaren gel.  You can continue the diclofenac  but stop the muscle relaxant.  For your blood sugars-cut back on sugar and carbohydrates such as rice, potatoes, bread, and pasta.   I will be in touch with your results

## 2024-11-06 NOTE — Progress Notes (Signed)
 "  Subjective:     Patient ID: Chad Simmons, male    DOB: [birth year], 29 y.o.   MRN: [id number]  Chief Complaint  Patient presents with   Elbow Pain    X3 months, both but left is worse. Wondering if needs PT referral  Also states had physical w job and blood work was elevated and was told to see pcp to check    HPI  Discussed the use of AI scribe software for clinical note transcription with the patient, who gave verbal consent to proceed.  History of Present Illness Chad Simmons is a 29 year old male who presents with bilateral elbow pain.  Bilateral elbow pain - Bilateral elbow pain, worse on the left - Pain localized to the medial and posterior aspects of both elbows - No radiation of pain - Pain triggered by pressure and certain positions - Elbow occasionally feels like it gives out - Discontinued exercise, but pain persisted - Currently avoids exercises that exacerbate symptoms  Paresthesia of upper extremities - Intermittent numbness or tingling in the elbows - Symptoms occur only with certain wrist positions or applied pressure  Chronic back pain - Uses a muscle relaxant for back pain due to a herniated disc  Prediabetes - Last hemoglobin A1c in July was 6.4% - Diet consists primarily of rice with limited vegetable intake - Drinks water, small amounts of orange juice in the morning, protein drinks, and occasional lemonade     Health Maintenance Due  Topic Date Due   Pneumococcal Vaccine (1 of 1 - PPSV23, PCV20, or PCV21) 06/17/2004   HPV VACCINES (3 - Risk male 3-dose series) 09/25/2021    Past Medical History:  Diagnosis Date   Abdominal pain    Asthma    Constipation    Decreased appetite    Eczema    Nausea    Weight loss     Past Surgical History:  Procedure Laterality Date   ORCHIOPEXY Bilateral 04/07/2022   Procedure: ORCHIOPEXY ADULT;  Surgeon: Nieves Cough, MD;  Location: [facility];  Service: Urology;  Laterality: Bilateral;     Family History  Problem Relation Age of Onset   Diabetes Mother    Irritable bowel syndrome Mother    Cholelithiasis Neg Hx    Ulcers Neg Hx    Colon cancer Neg Hx    Esophageal cancer Neg Hx    Stomach cancer Neg Hx     Social History   Socioeconomic History   Marital status: Single    Spouse name: Not on file   Number of children: Not on file   Years of education: Not on file   Highest education level: Not on file  Occupational History   Not on file  Tobacco Use   Smoking status: Never    Passive exposure: Never   Smokeless tobacco: Never  Vaping Use   Vaping status: Never Used  Substance and Sexual Activity   Alcohol use: No   Drug use: Never   Sexual activity: Yes  Other Topics Concern   Not on file  Social History Narrative   9th grade 2014-2015   Social Drivers of Health   Tobacco Use: Low Risk (11/06/2024)   Patient History    Smoking Tobacco Use: Never    Smokeless Tobacco Use: Never    Passive Exposure: Never  Financial Resource Strain: Not on file  Food Insecurity: Not on file  Transportation Needs: Not on file  Physical Activity: Not on file  Stress: Not  on file  Social Connections: Not on file  Intimate Partner Violence: Not on file  Depression [phone]): Low Risk (11/06/2024)   Depression (PHQ2-9)    PHQ-2 Score: 0  Alcohol Screen: Not on file  Housing: Not on file  Utilities: Not on file  Health Literacy: Not on file    Outpatient Medications Prior to Visit  Medication Sig Dispense Refill   albuterol  (VENTOLIN  HFA) 108 (90 Base) MCG/ACT inhaler Inhale 2 puffs into the lungs every 4 (four) hours as needed for wheezing or shortness of breath (cough, shortness of breath or wheezing.). 18 g 1   diclofenac  (CATAFLAM ) 50 MG tablet Take 1 tablet (50 mg total) by mouth as needed. 30 tablet 0   fluticasone  (FLONASE ) 50 MCG/ACT nasal spray Place 1 spray into both nostrils daily. Begin by using 2 sprays in each nare daily for 3 to 5 days, then  decrease to 1 spray in each nare daily. 15.8 mL 2   levocetirizine (XYZAL ) 5 MG tablet Take 1 tablet (5 mg total) by mouth every evening. 30 tablet 2   tizanidine (ZANAFLEX) 2 MG capsule Take 2 mg by mouth 3 (three) times daily.     triamcinolone  ointment (KENALOG ) 0.5 % Apply 1 Application topically 2 (two) times daily. 30 g 0   EPINEPHrine  0.3 mg/0.3 mL IJ SOAJ injection Inject 0.3 mg into the muscle as needed for anaphylaxis. (Patient not taking: Reported on 11/06/2024) 2 each 0   No facility-administered medications prior to visit.    Allergies[1]  Review of Systems  Constitutional:  Negative for chills and fever.  Respiratory:  Negative for shortness of breath.   Cardiovascular:  Negative for chest pain, palpitations and leg swelling.  Gastrointestinal:  Negative for abdominal pain, constipation, diarrhea, nausea and vomiting.  Genitourinary:  Negative for dysuria, frequency and urgency.  Musculoskeletal:  Positive for back pain and joint pain.  Neurological:  Negative for dizziness, focal weakness and headaches.       Objective:    Physical Exam Constitutional:      General: He is not in acute distress.    Appearance: He is not ill-appearing.  Eyes:     Extraocular Movements: Extraocular movements intact.     Conjunctiva/sclera: Conjunctivae normal.  Cardiovascular:     Rate and Rhythm: Normal rate.  Pulmonary:     Effort: Pulmonary effort is normal.  Musculoskeletal:     Right upper arm: Normal.     Left upper arm: Normal.     Left elbow: No swelling. Normal range of motion. Tenderness present in medial epicondyle.     Cervical back: Normal range of motion and neck supple.  Skin:    General: Skin is warm and dry.  Neurological:     General: No focal deficit present.     Mental Status: He is alert and oriented to person, place, and time.  Psychiatric:        Mood and Affect: Mood normal.        Behavior: Behavior normal.        Thought Content: Thought content  normal.      BP 108/78   Pulse 66   Temp 97.9 F (36.6 C) (Temporal)   Ht 5' 7 (1.702 m)   Wt 220 lb (99.8 kg)   SpO2 97%   BMI 34.46 kg/m  Wt Readings from Last 3 Encounters:  11/06/24 220 lb (99.8 kg)  05/31/24 221 lb (100.2 kg)  11/03/23 207 lb 6.4 oz (94.1 kg)  Assessment & Plan:   Problem List Items Addressed This Visit     Prediabetes - Primary   Relevant Orders   Hemoglobin A1c (Completed)   Comprehensive metabolic panel with GFR (Completed)   TSH (Completed)   Other Visit Diagnoses       Left elbow pain       Relevant Orders   Ambulatory referral to Sports Medicine     Pure hypercholesterolemia         Medial epicondylitis of elbow, left           Assessment and Plan Assessment & Plan Left elbow pain Chronic left elbow pain exacerbated by pressure and certain movements, with tenderness on the medial side. No recent injury reported. Pain persists despite cessation of heavy lifting exercises. - Referred to sports medicine for evaluation and management. - Advised use of Voltaren gel for topical anti-inflammatory treatment, ice and oral diclofenac  he has at home prn.  - Instructed to limit heavy lifting to prevent exacerbation of symptoms.  Prediabetes A1c of 6.4% in July, close to diabetes threshold. Family history of prediabetes on maternal side. Dietary habits include high carbohydrate intake with rice and limited vegetable consumption. Discussed the importance of dietary modifications to prevent progression to diabetes. - Ordered A1c test to assess current glycemic control. - Advised dietary modifications to reduce carbohydrate intake and increase vegetable consumption. - Encouraged reduction of sugary drinks, including lemonade, and increase water intake.  Pure hypercholesterolemia LDL cholesterol level of 102 mg/dL, slightly above target of less than 100 mg/dL. No immediate concern unless diabetes develops, which would necessitate a lower LDL  target. - Continue to monitor LDL levels, especially if diabetes develops.  Medial epicondylitis Suspected medial epicondylitis due to pain localized to the medial elbow and exacerbated by certain movements. - Referred to sports medicine for further evaluation and management. - Advised use of Voltaren gel for symptomatic relief.     I am having Jerona Dollar maintain his albuterol , EPINEPHrine , tizanidine, diclofenac , levocetirizine, fluticasone , and triamcinolone  ointment.  No orders of the defined types were placed in this encounter.      [1]  Allergies Allergen Reactions   Peanut-Containing Drug Products Anaphylaxis   "

## 2024-11-14 ENCOUNTER — Other Ambulatory Visit: Payer: Self-pay

## 2024-11-14 ENCOUNTER — Ambulatory Visit

## 2024-11-14 ENCOUNTER — Ambulatory Visit: Admitting: Family Medicine

## 2024-11-14 VITALS — BP 108/78 | HR 66 | Ht 67.0 in | Wt 220.0 lb

## 2024-11-14 DIAGNOSIS — M25521 Pain in right elbow: Secondary | ICD-10-CM

## 2024-11-14 DIAGNOSIS — M25522 Pain in left elbow: Secondary | ICD-10-CM | POA: Diagnosis not present

## 2024-11-14 NOTE — Progress Notes (Signed)
 "        I, Ileana Collet, PhD, LAT, ATC acting as a scribe for Artist Lloyd, MD.  Chad Simmons is a 29 y.o. male who presents to [facility] at [facility] today for bilat elbow pain, L>R x 3-4 months. He does his elbow will give out on his sometimes. Pt locates pain to the posterior-medial aspect of his elbow  He works for the w[employer].   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   Exam:  BP 108/78   Pulse 66   Ht 5' 7 (1.702 m)   Wt 220 lb (99.8 kg)   SpO2 97%   BMI 34.46 kg/m  General: Well Developed, well nourished, and in no acute distress.   MSK: Left elbow normal appearing normal motion.  Tender palpation posterior aspect of medial epicondyle.  Some pain present with resisted elbow extension. No pain with resisted wrist flexion or wrist motion.  Right elbow normal appearing nontender normal motion mildly painful at medial epicondyle with resisted elbow extension.    Lab and Radiology Results  [facility] MSK Ultrasound of: Bilateral medial and posterior elbow Left: No visible tear or disruption of triceps tendon at olecranon or medial epicondyle.  Trace increased vascular activity on Doppler at posterior medial epicondyle. Right normal appearing Impression: Triceps tendinitis   X-ray images bilateral elbows obtained today personally and independently interpreted.  Right elbow: Normal appearing elbow no significant degenerative changes no acute fractures.  Left elbow: Normal-appearing elbow.  No significant degenerative changes.  No acute fractures.  Await formal radiology review  Assessment and Plan: 29 y.o. male with bilateral posterior medial  elbow pain most consistent with triceps tendinitis.  Plan for occupational therapy referral.  If not improved consider advanced imaging.   PDMP not reviewed this encounter. Orders Placed This Encounter  Procedures   US  LIMITED JOINT SPACE STRUCTURES UP BILAT(NO LINKED CHARGES)    Reason for Exam (SYMPTOM  OR DIAGNOSIS REQUIRED):   bilateral elbow pain    Preferred imaging location?:   [facility]   DG ELBOW COMPLETE LEFT (3+VIEW)    Standing Status:   Future    Number of Occurrences:   1    Expiration Date:   12/15/2024    Reason for Exam (SYMPTOM  OR DIAGNOSIS REQUIRED):   bilat elbow pain    Preferred imaging location?:   [facility]   DG ELBOW COMPLETE RIGHT (3+VIEW)    Standing Status:   Future    Number of Occurrences:   1    Expiration Date:   12/15/2024    Reason for Exam (SYMPTOM  OR DIAGNOSIS REQUIRED):   bilat elbow pain    Preferred imaging location?:   [facility]   Ambulatory referral to Occupational Therapy    Referral Priority:   Routine    Referral Type:   Occupational Therapy    Referral Reason:   Specialty Services Required    Requested Specialty:   Occupational Therapy    Number of Visits Requested:   1   No orders of the defined types were placed in this encounter.    Discussed warning signs or symptoms. Please see discharge instructions. Patient expresses understanding.   The above documentation has been  reviewed and is accurate and complete Artist Lloyd, M.D.   "

## 2024-11-14 NOTE — Patient Instructions (Addendum)
 Thank you for coming in today.   Please get an Xray today before you leave   We've placed a referral to occupational therapy with Nate at [facility], they will reach out to assist with scheduling.   See you back in 2 months.

## 2024-11-15 NOTE — Therapy (Signed)
 " OUTPATIENT OCCUPATIONAL THERAPY ORTHO EVALUATION  Patient Name: Chad Simmons MRN: [id number] DOB:1996/02/26, 29 y.o., male Today's Date: 11/16/2024  PCP: Boby Mackintosh, NP-C REFERRING PROVIDER: Dr Joane  END OF SESSION:  OT End of Session - 11/16/24 1106     Visit Number 1    Number of Visits 5    Date for Recertification  12/28/24    Authorization Type [employer]    OT Start Time 1106    OT Stop Time 1152    OT Time Calculation (min) 46 min    Activity Tolerance Patient tolerated treatment well;No increased pain    Behavior During Therapy WFL for tasks assessed/performed          Past Medical History:  Diagnosis Date   Abdominal pain    Asthma    Constipation    Decreased appetite    Eczema    Nausea    Weight loss    Past Surgical History:  Procedure Laterality Date   ORCHIOPEXY Bilateral 04/07/2022   Procedure: ORCHIOPEXY ADULT;  Surgeon: Nieves Cough, MD;  Location: [facility];  Service: Urology;  Laterality: Bilateral;   Patient Active Problem List   Diagnosis Date Noted   Flexural eczema 05/31/2024   Low back pain without sciatica 05/31/2024   Obesity (BMI 30.0-34.9) 05/31/2024   Elevated liver enzymes 07/01/2023   Encounter for general adult medical examination with abnormal findings 02/02/2023   Muscle spasms of both lower extremities 02/02/2023   Screen for STD (sexually transmitted disease) 02/02/2023   Prediabetes 05/25/2021   Generalized abdominal pain 05/01/2021   Frequent bowel movements 05/01/2021   Mild intermittent asthma without complication 10/12/2017   Seasonal and perennial allergic rhinitis 10/12/2017   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   Mainly hurts when he is working out, specifically doing elbow extension repetitively.  Pain goes away after the gym session. He states when pushing down, sometimes his elbow gives out.  He states that his hands go numb at the 4th and 5th digits approximately 4-5 times a week when he is lying in bed posturing his elbow bent, holding a cell phone.     PERTINENT HISTORY: bilat elbow pain, L>R x 3-4 months. He does his elbow will give out on his sometimes. Pt locates pain to the posterior-medial aspect of his elbow   He works for the [employer].    PRECAUTIONS: None  RED FLAGS: None   WEIGHT BEARING RESTRICTIONS: No  PAIN:  Are you having pain? Yes: NPRS scale: moderate pain when lifting heavy Pain location: Bilateral cubital tunnels Pain description: Sharp, aching near the medial epicondyles Aggravating factors: Repetitive elbow extension with heavy weight, keeping elbow bent for long periods of time Relieving factors: Rest  FALLS: Has patient fallen in last 6 months? No  PLOF: Independent  PATIENT GOALS: Decrease pain in bilateral elbows   NEXT MD VISIT: As needed   OBJECTIVE: (All objective assessments below are from initial evaluation on: 11/16/24 unless otherwise specified.)   HAND DOMINANCE: Right   ADLs: Overall ADLs: States decreased  ability to lift heavy weights at the gym that involve extending his elbow like tricep presses and chest presses.  He denies significant functional issues at home other than some difficulty sleeping from paresthesias related to bent elbows   UPPER EXTREMITY ROM     Shoulder to Wrist AROM Right eval Left eval  Shoulder flexion    Shoulder abduction    Shoulder extension    Shoulder internal rotation    Shoulder external rotation    Elbow flexion 145 143  Elbow extension (-10) (-10)   Forearm supination 80 85  Forearm pronation  68 79  Wrist flexion 81 84  Wrist extension 76 82  (Blank rows = not tested)   Hand AROM Right eval Left eval  Full Fist Ability (or Gap to Distal Palmar Crease) WNL WNL  (Blank rows = not tested)   UPPER EXTREMITY MMT:     MMT Right 11/16/24 Left 11/16/24  Shoulder flexion    Shoulder abduction    Shoulder adduction    Shoulder extension    Shoulder internal rotation    Shoulder external rotation    Middle trapezius    Lower trapezius    Elbow flexion 5/5 5/5  Elbow extension 4-/5 4+/5  Forearm supination    Forearm pronation    Wrist flexion 5/5 5/5  Wrist extension 5/5 5/5  Wrist ulnar deviation    Wrist radial deviation    (Blank rows = not tested)  HAND FUNCTION: Eval: No weakness Grip strength Right: 170 lbs, Left: 176 lbs   COORDINATION: Eval: Only mild gross motor coordination impairments related to flexing and extending the elbow and pain related to that  SENSATION: Eval:  Light touch intact today, though he states digits 4 and 5 do go numb 4-5 times a week at night when bending the elbow, holding the cell phone for long amounts of time in bed  EDEMA:   Eval: None  COGNITION: Eval: Overall cognitive status: WFL for evaluation today   OBSERVATIONS:   Eval:  positive elbow flexion test on LT vs Rt, tender to ulnar nerve with Tinel's test L > R, no loss of strength in flexors or extensors of forearm, and biceps were strong equally.  Tricep was a little tender R > L with flexion.  He also has a history of hands going numb in the night with bent elbows and holding the cell phone in bed at least 4-5 times a week.  When asked where the left elbow hurts, he points directly to the ulnar nerve/cubital tunnel.  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    TODAY'S TREATMENT:  Post-evaluation treatment:   He was given information on management of nerve paresthesia, also nerve  gliding/neuromuscular reeducation to help with nerve pain, nerve inflammation, etc.  He was highly recommended to stop repetitive or heavy lifting for the next 1 to 2 months as needed to allow the nerve to rest.  During that time he should also do the following home exercise program to stretch the nerve gently, stretch the areas of the elbow forearm and humerus they contribute to nerve tightness.  He also was thoroughly educated on lifestyle change-he should not be bending his elbows in bed, holding his phone for prolonged amount of time in bed, doing anything that puts pressure or numbness through his elbow or digits.  He states understanding all of these recommendations and performs the exercises well today, so OT states he can come back as  needed over the next 6 weeks, but feel free to perform self-management-as he has no significant problems except for when he is lifting heavy weight or having bad postures in bed.   Exercises - Ulnar Nerve Flossing  - 3-4 x daily - 1-2 sets - 5-10 reps - Triceps Stretch- Do on back of chair   - 3-4 x daily - 3-5 reps - 15 hold - Fridge Door Stretch  - 4 x daily - 3-5 reps - 15 sec hold - Seated Wrist Extension Stretch  - 3-6 x daily - 3-5 reps - 15 hold   PATIENT EDUCATION: Education details: See tx section above for details  Person educated: Patient Education method: Verbal Instruction, Teach back, Handouts  Education comprehension: States and demonstrates understanding, Additional Education required    HOME EXERCISE PROGRAM: Access Code: [email or web address] URL: [email or web address] Date: 11/16/2024 Prepared by: Melvenia Ada   GOALS: Goals reviewed with patient? Yes   SHORT TERM GOALS: (STG required if POC>30 days) Target Date: 11/16/2024  Pt will demo/state understanding of initial HEP to improve pain levels and prerequisite motion. Goal status: Goal met   LONG TERM GOALS: Target Date: 12/28/2024  Pt will improve discomfort in  bilateral elbows with tricep extension MMT testing at at least 4+/5 to have increased ability to carry out higher-level tasks with less difficulty. Goal status: INITIAL   2.  Pt will decrease pain at worst from moderate to mild or better to have better sleep and occupational participation in daily roles. Goal status: INITIAL  3.  Pt will have less sensitive elbow flexion elbow flexion test, going from positive in approximately 10 seconds to greater than 30 seconds in the left elbow/arm to show less nerve irritation.  Goal status: Initial   ASSESSMENT:  CLINICAL IMPRESSION: Patient is a 29 y.o. male who was seen today for occupational therapy evaluation for soreness and pain in bilateral cubital tunnel areas with repetitive elbow extension and or heavy weight applied.  He also has some paresthesia symptoms that are somewhat frequent throughout the week in digits 4 and 5 bilaterally.  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  The patient will benefit from outpatient occupational therapy to decrease symptoms, improve functional upper extremity use, and increase quality of life.  As he understood the directions and home management plan well, he may not need much follow-up in therapy.  He was also recommended to seek possible injections, eventual surgery if he cannot control these things with rest and conservative management.  OT is confident if he rests, performs HEP these issues will subside.  PERFORMANCE DEFICITS: in functional skills including IADLs, ROM, strength, pain, fascial restrictions, body mechanics, endurance, and UE functional use, cognitive skills including problem solving and safety awareness, and psychosocial skills including coping strategies, environmental adaptation, habits, and routines and behaviors.   IMPAIRMENTS: are limiting patient from ADLs, IADLs, rest and sleep, and leisure.   COMORBIDITIES: may have co-morbidities   that affects occupational performance. Patient will benefit from skilled OT to address above impairments and improve overall function.  MODIFICATION OR ASSISTANCE TO COMPLETE EVALUATION: No modification of tasks or assist necessary to complete an evaluation.  OT OCCUPATIONAL PROFILE AND HISTORY: Problem focused assessment: Including review of records relating to presenting problem.  CLINICAL DECISION MAKING: LOW - limited treatment options, no task modification necessary  REHAB POTENTIAL: Good  EVALUATION COMPLEXITY: Low      PLAN:  OT FREQUENCY: Up to once a week  OT DURATION: Up to 6 weeks through 12/28/2024 and up to 5 total visits as needed   PLANNED INTERVENTIONS: 97535 self care/ADL training, 02889 therapeutic exercise, 97530 therapeutic activity, 97112 neuromuscular re-education, 97140 manual therapy, 97035 ultrasound, 97032 electrical stimulation (manual), 97760 Orthotic Initial, 97763 Orthotic/Prosthetic subsequent, compression bandaging, Dry needling, energy conservation, coping strategies training, and patient/family education  RECOMMENDED OTHER SERVICES: none now    CONSULTED AND AGREED WITH PLAN OF CARE: Patient  PLAN FOR NEXT SESSION:   Review initial HEP and recommendations, can try scratch collapse test, can upgrade to eccentric strengthening to help gliding of the medial elbow area.   Melvenia Ada, OTR/L, CHT  11/16/2024, 12:08 PM   "

## 2024-11-16 ENCOUNTER — Ambulatory Visit: Admitting: Rehabilitative and Restorative Service Providers"

## 2024-11-16 ENCOUNTER — Encounter: Payer: Self-pay | Admitting: Rehabilitative and Restorative Service Providers"

## 2024-11-16 DIAGNOSIS — R600 Localized edema: Secondary | ICD-10-CM | POA: Diagnosis not present

## 2024-11-16 DIAGNOSIS — M25522 Pain in left elbow: Secondary | ICD-10-CM

## 2024-11-16 DIAGNOSIS — M6281 Muscle weakness (generalized): Secondary | ICD-10-CM | POA: Diagnosis not present

## 2024-11-16 DIAGNOSIS — M25521 Pain in right elbow: Secondary | ICD-10-CM | POA: Diagnosis not present

## 2024-11-16 DIAGNOSIS — R202 Paresthesia of skin: Secondary | ICD-10-CM

## 2024-11-16 NOTE — Patient Instructions (Signed)
 Management of hand/arm numbness or "paresthesia"  Peripheral nerves do heal and regenerate- IF you are good to them and change habits/routines.   How you will be successful:  Change habits/routines, prevent "kinking the hose" and any numbness Exercise program to "unstick" nerves, loosen tightness in body Protect with supportive bracing as helpful  Avoid:  Repetitive or BAD prolonged postures Avoid "kinks" at neck, shoulder, elbow, forearm and wrist!! Generally, keep hands DOWN at night. Arm above head = bad Try to keep elbows at 90* or straighter  Don't bend or extend wrists extremely  Have pillow support for head/neck in neutral position  Watch resting with palm down and wrist flexed ALL THE TIME Things that hurt or cause numbness Direct pressure on "nerve points" For example: sleeping on hands, pressure while typing, resting wrist on steering wheel, resting elbow on center console, etc.   Specifics:  Change your sleep habits! Start every day with a non-painful stretch routine and gentle nerve "gliding" assigned by your therapist.  Stretch again, twice that day.   It's smart to gently warmup your body and stretch before doing typically stressful activities (or before bed if your hands hurt/go numb in the night).  If you must do repetitive or stressful activities, or can't break your sleep habits, try some type of support (wrist braces in night, etc.). Using an extra pillow and putting your hand in the pillowcase could help, too. Don't hold your phone to long! Using handles with padding, padded gloves can help with compression or vibration. Take breaks for rest and recovery; don't "force" yourself to finish a job.   Keep hands covered and warm in the winter or cool, rainy weather.

## 2024-11-21 ENCOUNTER — Ambulatory Visit: Payer: Self-pay | Admitting: Family Medicine

## 2024-11-21 NOTE — Progress Notes (Signed)
Right elbow x-ray looks okay to radiology.

## 2024-11-21 NOTE — Progress Notes (Signed)
 Left elbow x-ray looks okay to radiology.

## 2025-01-14 ENCOUNTER — Ambulatory Visit: Admitting: Family Medicine
